# Patient Record
Sex: Female | Born: 1995 | Race: White | Hispanic: No | Marital: Married | Smoking: Never smoker
Health system: Southern US, Community
[De-identification: ages and names within clinical notes are randomized; demographics above are authoritative.]

## PROBLEM LIST (undated history)

## (undated) DIAGNOSIS — G4730 Sleep apnea, unspecified: Secondary | ICD-10-CM

## (undated) DIAGNOSIS — J020 Streptococcal pharyngitis: Secondary | ICD-10-CM

## (undated) HISTORY — PX: TONSILLECTOMY: SUR1361

## (undated) HISTORY — DX: Sleep apnea, unspecified: G47.30

---
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
## 2014-07-16 ENCOUNTER — Encounter: Payer: Self-pay | Admitting: Adult Health

## 2014-07-17 IMAGING — CT CT ORBITS W/ CM
3 series · 15 of 47 positions shown, 18 images · IV contrast (omnipaque)
Comparison: The globes and extraocular muscles appear intact and
symmetrical.

CLINICAL DATA: Bacterial infection in the right eye, worsening
since yesterday.

CT ORBITS WITH CONTRAST
TECHNIQUE: Multidetector CT imaging of the orbits was performed
following the bolus administration of intravenous contrast.
Contrast: 75mL OMNIPAQUE IOHEXOL 300 MG/ML  SOLN

[Series 2: max st axial · axial · 0.33mm/px · z∈[+63,+155]mm · 9 of 54 slices shown, 12 images]
[im 4/54  brain]
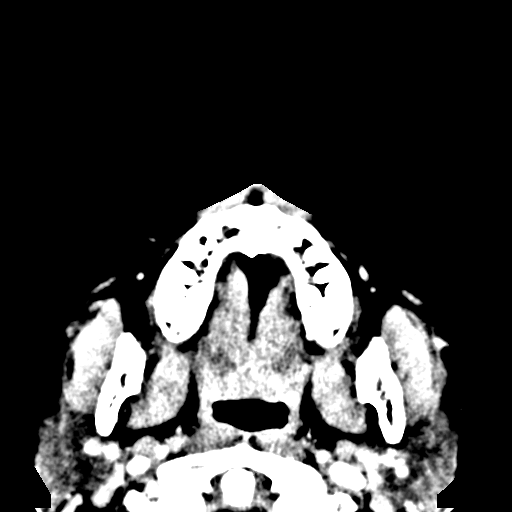
[im 4/54  bone]
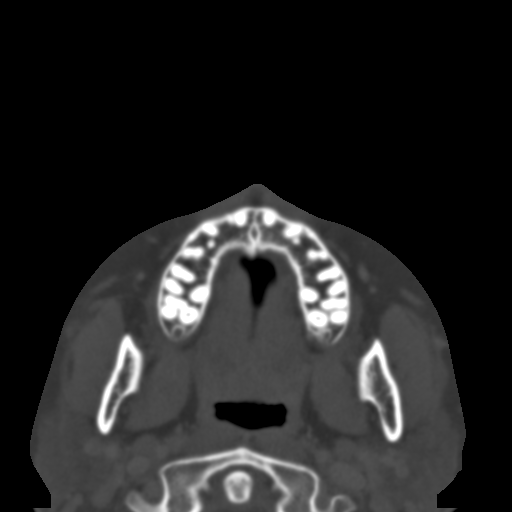
[im 10/54  bone]
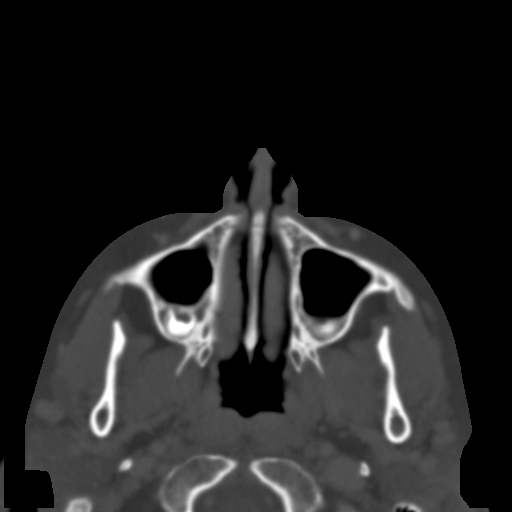
[im 15/54  bone]
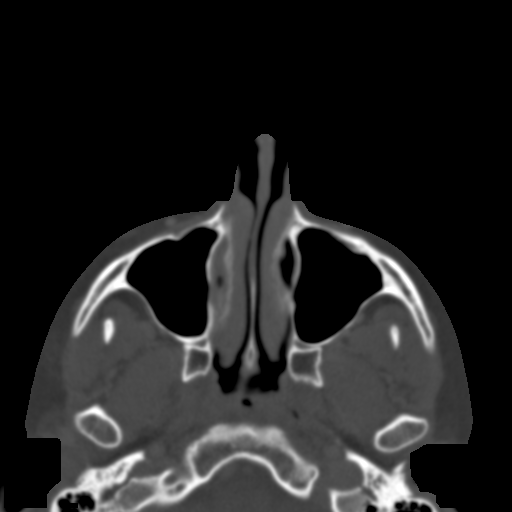
[im 21/54  bone]
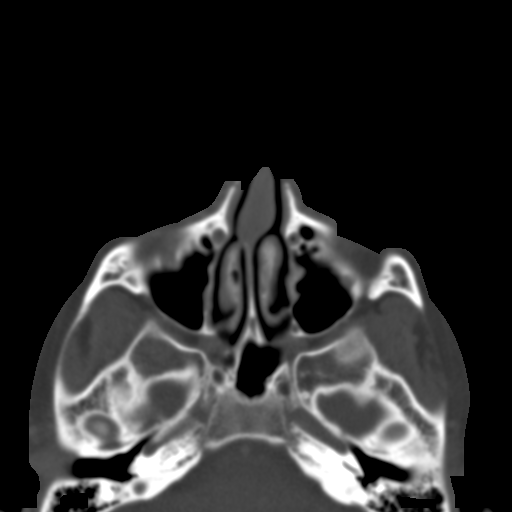
[im 28/54  brain]
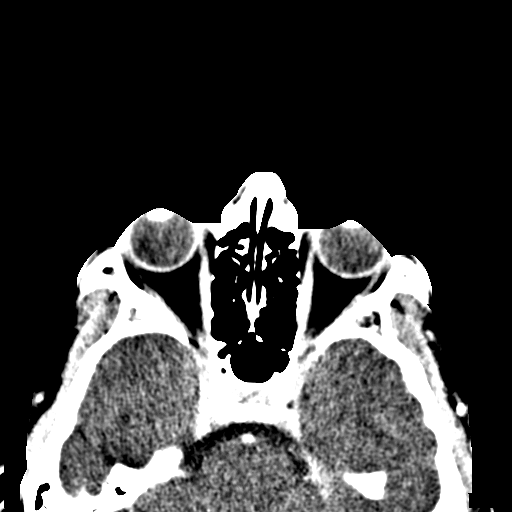
[im 28/54  bone]
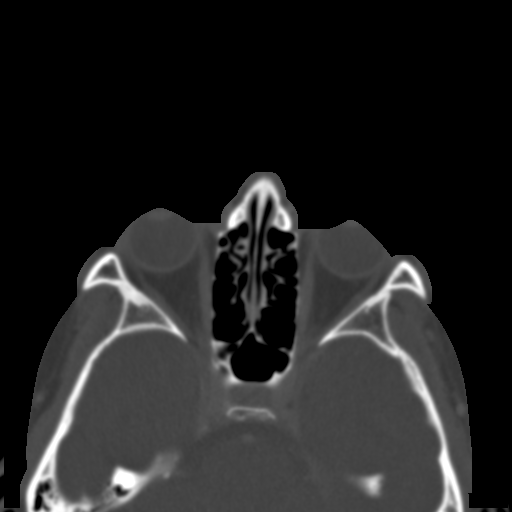
[im 33/54  bone]
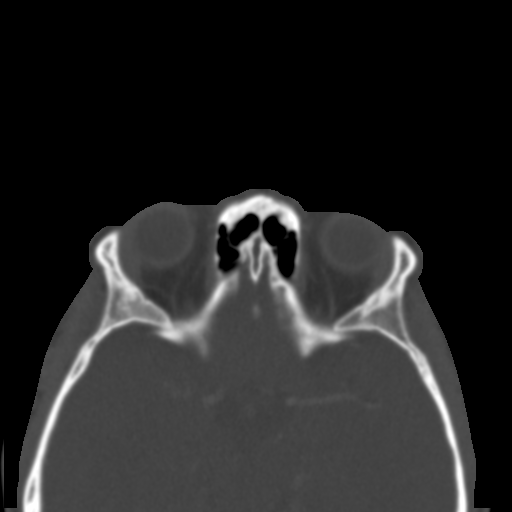
[im 39/54  bone]
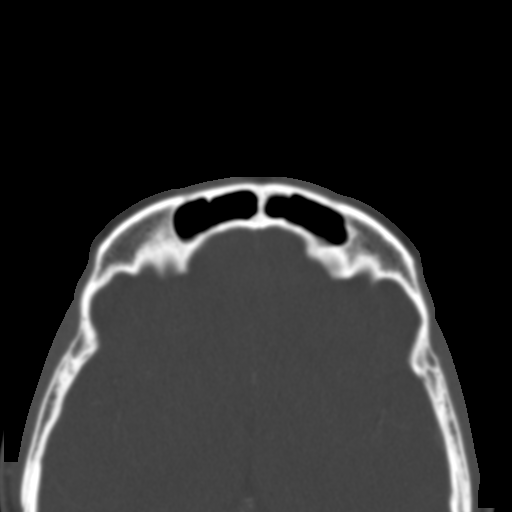
[im 44/54  bone]
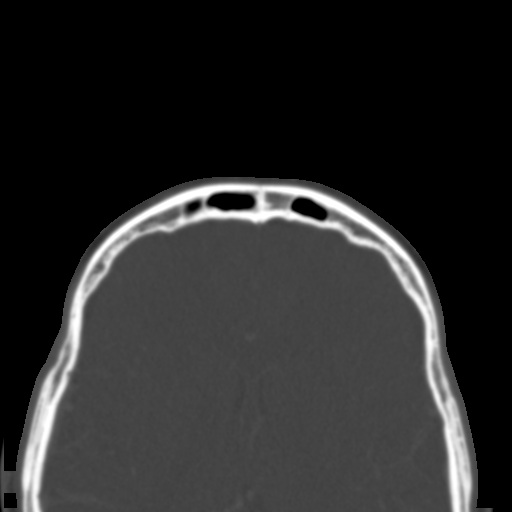
[im 50/54  brain]
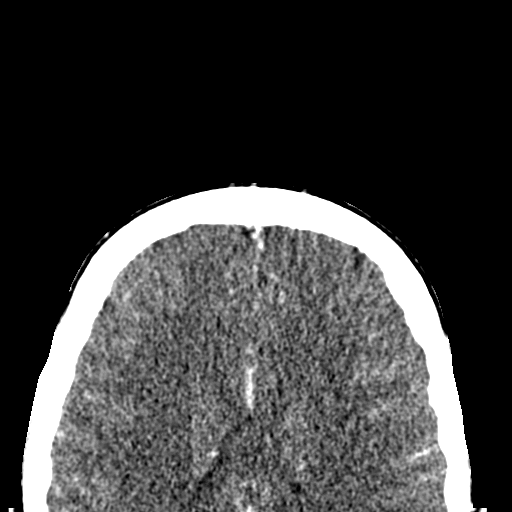
[im 50/54  bone]
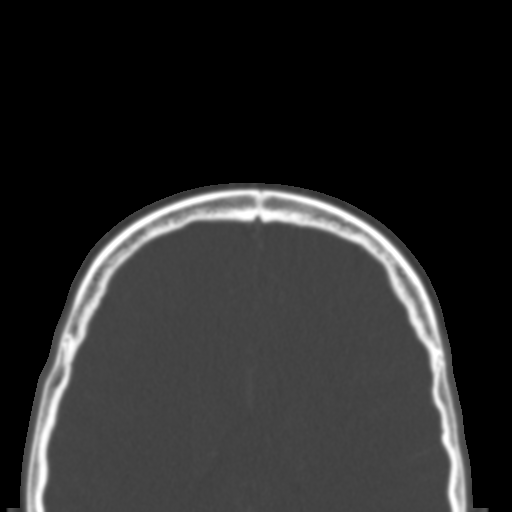

[Series 4: max st coro · coronal · 0.22mm/px · 3 of 57 slices shown]
[im 19/57  bone]
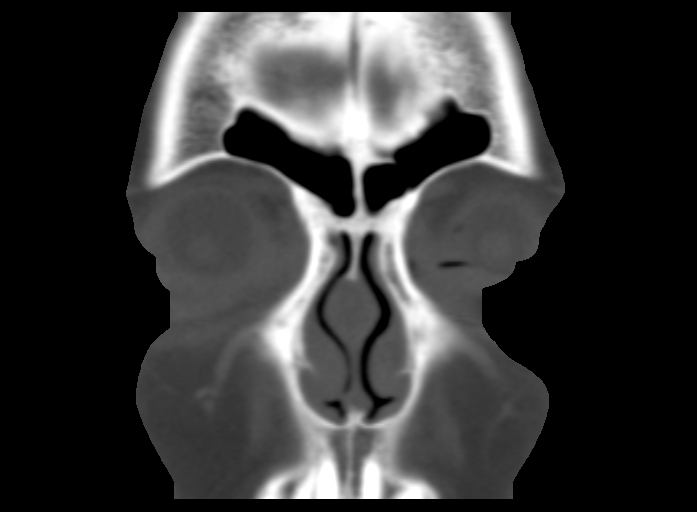
[im 25/57  bone]
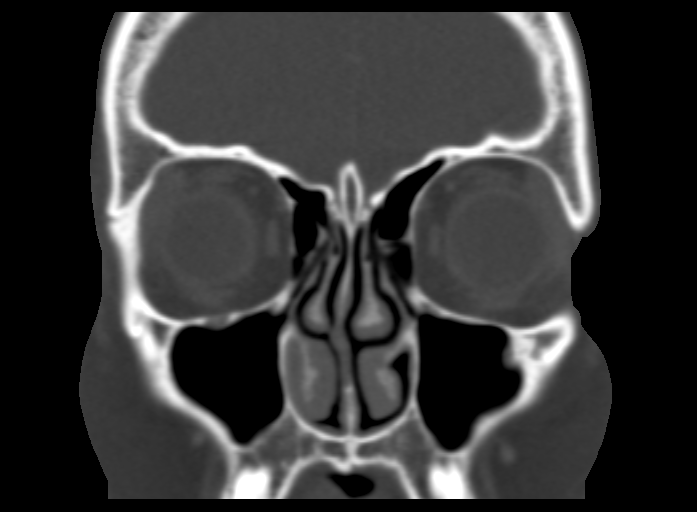
[im 32/57  bone]
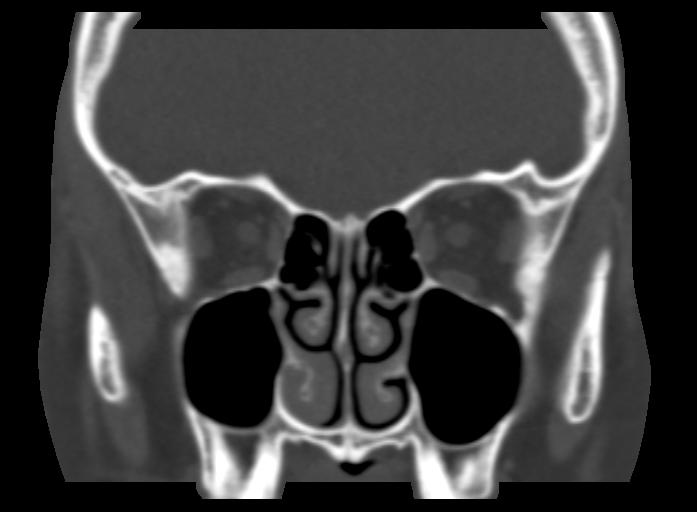

[Series 5: max st sag · sagittal · 0.22mm/px · 3 of 84 slices shown]
[im 28/84  bone]
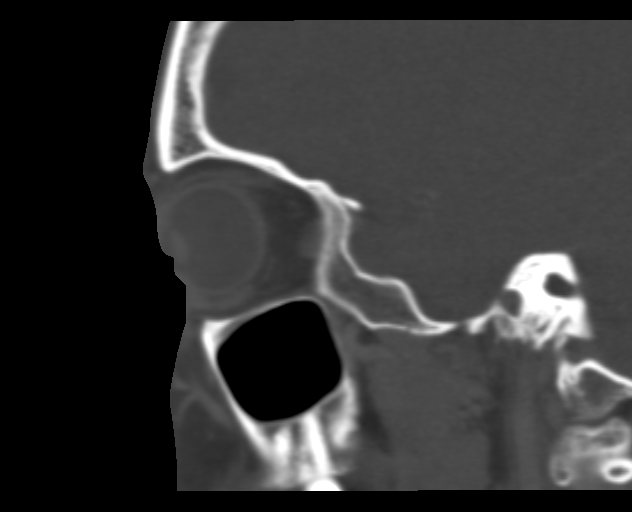
[im 42/84  bone]
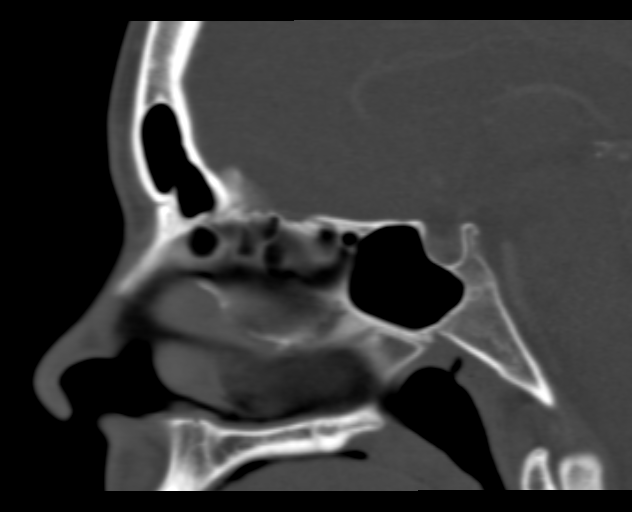
[im 56/84  bone]
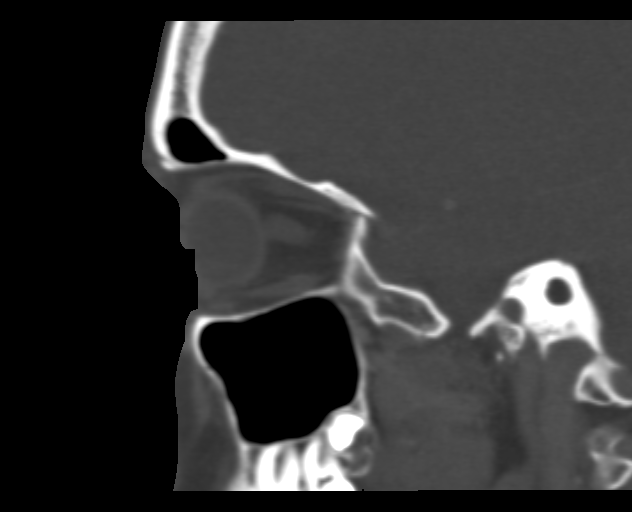

[15 of 47 positions shown; findings below may reference images not displayed]

Mild hyperemia with central low attenuation change
inferior to the right lacrimal glands, slight asymmetry with
respect to the left.  This could represent inflammatory process or
normal variation.  No significant periorbital or retrobulbar
infiltration.  Paranasal sinuses are not opacified.  No acute air-
fluid levels are demonstrated.  The visualized orbital and facial
bones appear intact.  No displaced fractures are identified.
FINDINGS: 
IMPRESSION: Mild asymmetry of the right sided lacrimal glands extending
inferiorly with respect to the left side could represent infectious
or inflammatory process. Tiny abscess not excluded.  No significant
periorbital or retrobulbar infiltration.

## 2015-05-20 DIAGNOSIS — O24419 Gestational diabetes mellitus in pregnancy, unspecified control: Secondary | ICD-10-CM

## 2015-05-20 DIAGNOSIS — O139 Gestational [pregnancy-induced] hypertension without significant proteinuria, unspecified trimester: Secondary | ICD-10-CM

## 2016-10-19 ENCOUNTER — Encounter (HOSPITAL_COMMUNITY): Payer: Self-pay

## 2016-10-19 ENCOUNTER — Emergency Department (HOSPITAL_COMMUNITY)
Admission: EM | Admit: 2016-10-19 | Discharge: 2016-10-20 | Disposition: A | Discharge status: Home / Self Care | Payer: Medicaid Other | Attending: Emergency Medicine | Admitting: Emergency Medicine

## 2016-10-19 DIAGNOSIS — R319 Hematuria, unspecified: Secondary | ICD-10-CM | POA: Insufficient documentation

## 2016-10-19 LAB — CBC WITH DIFFERENTIAL/PLATELET
BASOS ABS: 0.1 10*3/uL (ref 0.0–0.1)
Basophils Relative: 1 %
EOS PCT: 1 %
Eosinophils Absolute: 0.1 10*3/uL (ref 0.0–0.7)
HCT: 41.4 % (ref 36.0–46.0)
Hemoglobin: 14.1 g/dL (ref 12.0–15.0)
LYMPHS PCT: 31 %
Lymphs Abs: 3.6 10*3/uL (ref 0.7–4.0)
MCH: 29.7 pg (ref 26.0–34.0)
MCHC: 34.1 g/dL (ref 30.0–36.0)
MCV: 87.3 fL (ref 78.0–100.0)
Monocytes Absolute: 0.8 10*3/uL (ref 0.1–1.0)
Monocytes Relative: 7 %
Neutro Abs: 7.1 10*3/uL (ref 1.7–7.7)
Neutrophils Relative %: 60 %
PLATELETS: 337 10*3/uL (ref 150–400)
RBC: 4.74 MIL/uL (ref 3.87–5.11)
RDW: 12.8 % (ref 11.5–15.5)
WBC: 11.7 10*3/uL — ABNORMAL HIGH (ref 4.0–10.5)

## 2016-10-19 LAB — BASIC METABOLIC PANEL
ANION GAP: 8 (ref 5–15)
BUN: 13 mg/dL (ref 6–20)
CO2: 26 mmol/L (ref 22–32)
Calcium: 9.3 mg/dL (ref 8.9–10.3)
Chloride: 105 mmol/L (ref 101–111)
Creatinine, Ser: 0.72 mg/dL (ref 0.44–1.00)
GFR calc Af Amer: >60 mL/min (ref >60)
GFR calc non Af Amer: >60 mL/min (ref >60)
Glucose, Bld: 96 mg/dL (ref 65–99)
POTASSIUM: 3.4 mmol/L — AB (ref 3.5–5.1)
SODIUM: 139 mmol/L (ref 135–145)

## 2016-10-19 LAB — PREGNANCY, URINE: PREG TEST UR: NEGATIVE

## 2016-10-19 NOTE — ED Triage Notes (Signed)
Patient states that she noticed some blood in her urine off and on for a couple of months.  Today there is a large amount of blood in her urine.

## 2016-10-19 NOTE — ED Provider Notes (Signed)
[facility]-EMERGENCY DEPT Provider Note   CSN: [id number] Arrival date & time: 10/19/16  2229     History   Chief Complaint Chief Complaint  Patient presents with  . Hematuria    HPI Suzanne James is a 21 y.o. female.  Patient is a 21 year old female who presents to the emergency department complaining of blood in the urine.  The patient states that this problem started approximately a week ago. She noted blood in the commode. She states that she has these episodes approximately 3 times during the course of his 7 day week. Today she noticed more blood than usual, and came to the emergency department. She denies any recent injury or trauma to the bladder or vaginal area. She's not had any pain with urination. There's been no fever, no chills, no back pain and no vomiting. She has not had this problem in the past. She states her last menstrual period was 09/05/2016.      Past Medical History:  Diagnosis Date  . Strep throat     There are no active problems to display for this patient.   Past Surgical History:  Procedure Laterality Date  . TONSILLECTOMY      OB History    Gravida Para Term Preterm AB Living   1             SAB TAB Ectopic Multiple Live Births                   Home Medications    Prior to Admission medications   Medication Sig Start Date End Date Taking? Authorizing Provider  clindamycin (CLEOCIN) 300 MG capsule Take 1 capsule (300 mg total) by mouth 3 (three) times daily. 03/24/13   John-Adam Bonk, MD  erythromycin ophthalmic ointment Place 1 application into the right eye at bedtime.    Historical Provider, MD    Family History No family history on file.  Social History Social History  Substance Use Topics  . Smoking status: Never Smoker  . Smokeless tobacco: Never Used  . Alcohol use No     Allergies   Patient has no known allergies.   Review of Systems Review of Systems  Constitutional: Negative for activity change.       All ROS  Neg except as noted in HPI  HENT: Negative for nosebleeds.   Eyes: Negative for photophobia and discharge.  Respiratory: Negative for cough, shortness of breath and wheezing.   Cardiovascular: Negative for chest pain and palpitations.  Gastrointestinal: Negative for abdominal pain and blood in stool.  Genitourinary: Positive for hematuria. Negative for dysuria, flank pain and frequency.  Musculoskeletal: Negative for arthralgias, back pain and neck pain.  Skin: Negative.   Neurological: Negative for dizziness, seizures and speech difficulty.  Psychiatric/Behavioral: Negative for confusion and hallucinations.     Physical Exam Updated Vital Signs BP 130/77 (BP Location: Left Arm)   Pulse 83   Temp 97.9 F (36.6 C) (Oral)   Resp 16   Ht 5\' 3"  (1.6 m)   Wt 86.2 kg   LMP 09/05/2016 (Exact Date)   SpO2 100%   BMI 33.66 kg/m   Physical Exam  Constitutional: She is oriented to person, place, and time. She appears well-developed and well-nourished.  Non-toxic appearance.  HENT:  Head: Normocephalic.  Right Ear: Tympanic membrane and external ear normal.  Left Ear: Tympanic membrane and external ear normal.  Eyes: EOM and lids are normal. Pupils are equal, round, and reactive to light.  Neck:  Normal range of motion. Neck supple. Carotid bruit is not present.  Cardiovascular: Normal rate, regular rhythm, normal heart sounds, intact distal pulses and normal pulses.   Pulmonary/Chest: Breath sounds normal. No respiratory distress.  Abdominal: Soft. Bowel sounds are normal. There is no tenderness. There is no guarding.  No CVA tenderness appreciated. No lower abdomen pain. No guarding, no rigidity.  Musculoskeletal: Normal range of motion.  Lymphadenopathy:       Head (right side): No submandibular adenopathy present.       Head (left side): No submandibular adenopathy present.    She has no cervical adenopathy.  Neurological: She is alert and oriented to person, place, and time. She  has normal strength. No cranial nerve deficit or sensory deficit.  Skin: Skin is warm and dry.  Psychiatric: She has a normal mood and affect. Her speech is normal.  Nursing note and vitals reviewed.    ED Treatments / Results  Labs (all labs ordered are listed, but only abnormal results are displayed) Labs Reviewed  CBC WITH DIFFERENTIAL/PLATELET - Abnormal; Notable for the following:       Result Value   WBC 11.7 (*)    All other components within normal limits  URINALYSIS, ROUTINE W REFLEX MICROSCOPIC  PREGNANCY, URINE  BASIC METABOLIC PANEL    EKG  EKG Interpretation None       Radiology No results found.  Procedures Procedures (including critical care time)  Medications Ordered in ED Medications - No data to display   Initial Impression / Assessment and Plan / ED Course  I have reviewed the triage vital signs and the nursing notes.  Pertinent labs & imaging results that were available during my care of the patient were reviewed by me and considered in my medical decision making (see chart for details).     *I have reviewed nursing notes, vital signs, and all appropriate lab and imaging results for this patient.**  Final Clinical Impressions(s) / ED Diagnoses  Vital signs within normal limits. Vital signs shows potassium to be low at 3.4, otherwise within normal limits. The anion gap is 8, within normal limits. The complete blood count shows the white blood cells to be elevated at 11,700, there is no shift to the left. Urinalysis shows a red hazy specimen with a specific gravity 1.010. There is a large hemoglobin present. There is a trace of leukocytes. The microscopic shows too many to count red cells, 6-30 white blood cells, few bacteria, and 6-30 squamous cells. Urine pregnancy is negative.  The urine will be sent to the lab for culture. Patient will be placed on Keflex. I've asked the patient to observe for any changes in her menstrual cycle.   Final  diagnoses:  None    New Prescriptions New Prescriptions   No medications on file     Ivery Quale, PA-C 10/20/16 0050    Shon Baton, MD 10/20/16 0300

## 2016-10-20 LAB — URINALYSIS, ROUTINE W REFLEX MICROSCOPIC
BILIRUBIN URINE: NEGATIVE
Glucose, UA: NEGATIVE mg/dL
Ketones, ur: NEGATIVE mg/dL
NITRITE: NEGATIVE
Protein, ur: 30 mg/dL — AB
Specific Gravity, Urine: 1.01 (ref 1.005–1.030)
pH: 5 (ref 5.0–8.0)

## 2016-10-20 LAB — URINALYSIS, MICROSCOPIC (REFLEX)

## 2016-10-20 MED ORDER — ONDANSETRON HCL 4 MG PO TABS
4.0000 mg | ORAL_TABLET | Freq: Once | ORAL | Status: AC
  Administered 2016-10-20: 4 mg via ORAL
  Filled 2016-10-20: qty 1

## 2016-10-20 MED ORDER — CEPHALEXIN 500 MG PO CAPS
500.0000 mg | ORAL_CAPSULE | Freq: Once | ORAL | Status: AC
  Administered 2016-10-20: 500 mg via ORAL
  Filled 2016-10-20: qty 1

## 2016-10-20 MED ORDER — CEPHALEXIN 500 MG PO CAPS: 500.0000 mg | ORAL_CAPSULE | Freq: Four times a day (QID) | ORAL | 0 refills | Status: DC

## 2016-10-20 NOTE — Discharge Instructions (Signed)
Your vital signs within normal limits. Your urine pregnancy test is negative. Your urine analysis questions a urinary tract infection. A culture has been sent to the lab. You'll be started on antibiotic Keflex to be used 4 times daily with food. Please increase fluids. Please monitor for any menstrual changes. Please see your primary physician or return to the emergency department if any changes, problems, or concerns.

## 2016-10-21 LAB — URINE CULTURE

## 2016-10-28 NOTE — ED Notes (Signed)
Pt called inquiring about culture results. Consulted EDP regarding culture results and reported culture did not grow out anything and for pt to follow up with PCP. Pt contacted and informed regarding culture results and verbalized understanding of following up with PCP.

## 2020-06-14 ENCOUNTER — Encounter: Payer: Self-pay | Admitting: Women's Health

## 2020-06-14 ENCOUNTER — Ambulatory Visit (INDEPENDENT_AMBULATORY_CARE_PROVIDER_SITE_OTHER): Payer: BLUE CROSS/BLUE SHIELD | Admitting: Women's Health

## 2020-06-14 VITALS — BP 131/74 | HR 87 | Ht 63.0 in | Wt 222.0 lb

## 2020-06-14 DIAGNOSIS — Z3201 Encounter for pregnancy test, result positive: Secondary | ICD-10-CM | POA: Diagnosis not present

## 2020-06-14 DIAGNOSIS — Z3491 Encounter for supervision of normal pregnancy, unspecified, first trimester: Secondary | ICD-10-CM

## 2020-06-14 DIAGNOSIS — F419 Anxiety disorder, unspecified: Secondary | ICD-10-CM | POA: Diagnosis not present

## 2020-06-14 LAB — POCT URINE PREGNANCY: Preg Test, Ur: POSITIVE — AB

## 2020-06-14 MED ORDER — SERTRALINE HCL 25 MG PO TABS: 25.0000 mg | ORAL_TABLET | Freq: Every day | ORAL | 6 refills | Status: DC

## 2020-06-14 NOTE — Progress Notes (Signed)
   GYN VISIT Patient name: Suzanne James MRN [id number]  Date of birth: 1996/01/02 Chief Complaint:   Possible Pregnancy  History of Present Illness:   Suzanne James is a 24 y.o. G71P0 Caucasian female at [redacted]w[redacted]d by LMP of 8/1, being seen today for +HPT. Some bleeding earlier in pregnancy, now just occ light brown-was being followed by midwife in [place], decided to switch care to here b/c was having a hard time getting appointments.  Some nausea, no vomiting. Declines meds. Some anxiety- has h/o anxiety, was on zoloft 25mg ,stopped in April b/c she felt better, but feels it is returning and needs to restart meds. Denies depression.   Depression screen PHQ 2/9 06/14/2020  Decreased Interest 0  Down, Depressed, Hopeless 0  PHQ - 2 Score 0  Altered sleeping 0  Tired, decreased energy 0  Change in appetite 0  Feeling bad or failure about yourself  0  Trouble concentrating 0  Moving slowly or fidgety/restless 0  Suicidal thoughts 0  PHQ-9 Score 0  Difficult doing work/chores Not difficult at all    Patient's last menstrual period was 04/14/2020. Review of Systems:   Pertinent items are noted in HPI Denies fever/chills, dizziness, headaches, visual disturbances, fatigue, shortness of breath, chest pain, abdominal pain, vomiting, abnormal vaginal discharge/itching/odor/irritation, problems with periods, bowel movements, urination, or intercourse unless otherwise stated above.  Pertinent History Reviewed:  Reviewed past medical,surgical, social, obstetrical and family history.  Reviewed problem list, medications and allergies. Physical Assessment:   Vitals:   06/14/20 1206  BP: 131/74  Pulse: 87  Weight: 222 lb (100.7 kg)  Height: 5\' 3"  (1.6 m)  Body mass index is 39.33 kg/m.       Physical Examination:   General appearance: alert, well appearing, and in no distress  Mental status: alert, oriented to person, place, and time  Skin: warm & dry   Cardiovascular: normal heart rate  noted  Respiratory: normal respiratory effort, no distress  Abdomen: soft, non-tender   Pelvic: examination not indicated  Extremities: no edema   Informal TA u/s: +FCA, ~7-8wks  Chaperone: n/a    Results for orders placed or performed in visit on 06/14/20 (from the past 24 hour(s))  POCT urine pregnancy   Collection Time: 06/14/20 12:05 PM  Result Value Ref Range   Preg Test, Ur Positive (A) Negative    Assessment & Plan:  1) Pregnant> [redacted]w[redacted]d by LMP, will get dating u/s, then schedule new ob  2) Anxiety> restart zoloft 25mg , rx sent  Meds:  Meds ordered this encounter  Medications  . sertraline (ZOLOFT) 25 MG tablet    Sig: Take 1 tablet (25 mg total) by mouth daily.    Dispense:  30 tablet    Refill:  6    Order Specific Question:   Supervising Provider    Answer:   08/14/20 H [[id number]]    Orders Placed This Encounter  Procedures  . [redacted]w[redacted]d OB Comp Less 14 Wks  . POCT urine pregnancy    Return for 1st available, dating u/s.  CNM, [facility] 06/14/2020 12:57 PM

## 2020-06-14 NOTE — Patient Instructions (Signed)
Suzanne James, I greatly value your feedback.  If you receive a survey following your visit with [place] today, we appreciate you taking the time to fill it out.  Thanks, Joellyn Haff, CNM, WHNP-BC   [facility] ([address] [place], [place] [address]) Entrance C, located off of [address] Free 24/7 valet parking   Nausea & Vomiting  Have saltine crackers or pretzels by your bed and eat a few bites before you raise your head out of bed in the morning  Eat small frequent meals throughout the day instead of large meals  Drink plenty of fluids throughout the day to stay hydrated, just don't drink a lot of fluids with your meals.  This can make your stomach fill up faster making you feel sick  Do not brush your teeth right after you eat  Products with real ginger are good for nausea, like ginger ale and ginger hard candy Make sure it says made with real ginger!  Sucking on sour candy like lemon heads is also good for nausea  If your prenatal vitamins make you nauseated, take them at night so you will sleep through the nausea  Sea Bands  If you feel like you need medicine for the nausea & vomiting please let us know  If you are unable to keep any fluids or food down please let us know   Constipation  Drink plenty of fluid, preferably water, throughout the day  Eat foods high in fiber such as fruits, vegetables, and grains  Exercise, such as walking, is a good way to keep your bowels regular  Drink warm fluids, especially warm prune juice, or decaf coffee  Eat a 1/2 cup of real oatmeal (not instant), 1/2 cup applesauce, and 1/2-1 cup warm prune juice every day  If needed, you may take Colace (docusate sodium) stool softener once or twice a day to help keep the stool soft.   If you still are having problems with constipation, you may take Miralax once daily as needed to help keep your bowels regular.   Home Blood Pressure Monitoring for Patients    Your provider has recommended that you check your blood pressure (BP) at least once a week at home. If you do not have a blood pressure cuff at home, one will be provided for you. Contact your provider if you have not received your monitor within 1 week.   Helpful Tips for Accurate Home Blood Pressure Checks   Don't smoke, exercise, or drink caffeine 30 minutes before checking your BP  Use the restroom before checking your BP (a full bladder can raise your pressure)  Relax in a comfortable upright chair  Feet on the ground  Left arm resting comfortably on a flat surface at the level of your heart  Legs uncrossed  Back supported  Sit quietly and don't talk  Place the cuff on your bare arm  Adjust snuggly, so that only two fingertips can fit between your skin and the top of the cuff  Check 2 readings separated by at least one minute  Keep a log of your BP readings  For a visual, please reference this diagram: http://ccnc.care/bpdiagram  Provider Name: [facility]     Phone: [phone]  Zone 1: ALL CLEAR  Continue to monitor your symptoms:   BP reading is less than 140 (top number) or less than 90 (bottom number)   No right upper stomach pain  No headaches or seeing  spots  No feeling nauseated or throwing up  No swelling in face and hands  Zone 2: CAUTION Call your doctor's office for any of the following:   BP reading is greater than 140 (top number) or greater than 90 (bottom number)   Stomach pain under your ribs in the middle or right side  Headaches or seeing spots  Feeling nauseated or throwing up  Swelling in face and hands  Zone 3: EMERGENCY  Seek immediate medical care if you have any of the following:   BP reading is greater than160 (top number) or greater than 110 (bottom number)  Severe headaches not improving with Tylenol  Serious difficulty catching your breath  Any worsening symptoms from Zone 2    First Trimester of  Pregnancy The first trimester of pregnancy is from week 1 until the end of week 12 (months 1 through 3). A week after a sperm fertilizes an egg, the egg will implant on the wall of the uterus. This embryo will begin to develop into a baby. Genes from you and your partner are forming the baby. The female genes determine whether the baby is a boy or a girl. At 6-8 weeks, the eyes and face are formed, and the heartbeat can be seen on ultrasound. At the end of 12 weeks, all the baby's organs are formed.  Now that you are pregnant, you will want to do everything you can to have a healthy baby. Two of the most important things are to get good prenatal care and to follow your health care provider's instructions. Prenatal care is all the medical care you receive before the baby's birth. This care will help prevent, find, and treat any problems during the pregnancy and childbirth. BODY CHANGES Your body goes through many changes during pregnancy. The changes vary from woman to woman.   You may gain or lose a couple of pounds at first.  You may feel sick to your stomach (nauseous) and throw up (vomit). If the vomiting is uncontrollable, call your health care provider.  You may tire easily.  You may develop headaches that can be relieved by medicines approved by your health care provider.  You may urinate more often. Painful urination may mean you have a bladder infection.  You may develop heartburn as a result of your pregnancy.  You may develop constipation because certain hormones are causing the muscles that push waste through your intestines to slow down.  You may develop hemorrhoids or swollen, bulging veins (varicose veins).  Your breasts may begin to grow larger and become tender. Your nipples may stick out more, and the tissue that surrounds them (areola) may become darker.  Your gums may bleed and may be sensitive to brushing and flossing.  Dark spots or blotches (chloasma, mask of pregnancy)  may develop on your face. This will likely fade after the baby is born.  Your menstrual periods will stop.  You may have a loss of appetite.  You may develop cravings for certain kinds of food.  You may have changes in your emotions from day to day, such as being excited to be pregnant or being concerned that something may go wrong with the pregnancy and baby.  You may have more vivid and strange dreams.  You may have changes in your hair. These can include thickening of your hair, rapid growth, and changes in texture. Some women also have hair loss during or after pregnancy, or hair that feels dry or thin. Your hair  will most likely return to normal after your baby is born. WHAT TO EXPECT AT YOUR PRENATAL VISITS During a routine prenatal visit:  You will be weighed to make sure you and the baby are growing normally.  Your blood pressure will be taken.  Your abdomen will be measured to track your baby's growth.  The fetal heartbeat will be listened to starting around week 10 or 12 of your pregnancy.  Test results from any previous visits will be discussed. Your health care provider may ask you:  How you are feeling.  If you are feeling the baby move.  If you have had any abnormal symptoms, such as leaking fluid, bleeding, severe headaches, or abdominal cramping.  If you have any questions. Other tests that may be performed during your first trimester include:  Blood tests to find your blood type and to check for the presence of any previous infections. They will also be used to check for low iron levels (anemia) and Rh antibodies. Later in the pregnancy, blood tests for diabetes will be done along with other tests if problems develop.  Urine tests to check for infections, diabetes, or protein in the urine.  An ultrasound to confirm the proper growth and development of the baby.  An amniocentesis to check for possible genetic problems.  Fetal screens for spina bifida and  Down syndrome.  You may need other tests to make sure you and the baby are doing well. HOME CARE INSTRUCTIONS  Medicines  Follow your health care provider's instructions regarding medicine use. Specific medicines may be either safe or unsafe to take during pregnancy.  Take your prenatal vitamins as directed.  If you develop constipation, try taking a stool softener if your health care provider approves. Diet  Eat regular, well-balanced meals. Choose a variety of foods, such as meat or vegetable-based protein, fish, milk and low-fat dairy products, vegetables, fruits, and whole grain breads and cereals. Your health care provider will help you determine the amount of weight gain that is right for you.  Avoid raw meat and uncooked cheese. These carry germs that can cause birth defects in the baby.  Eating four or five small meals rather than three large meals a day may help relieve nausea and vomiting. If you start to feel nauseous, eating a few soda crackers can be helpful. Drinking liquids between meals instead of during meals also seems to help nausea and vomiting.  If you develop constipation, eat more high-fiber foods, such as fresh vegetables or fruit and whole grains. Drink enough fluids to keep your urine clear or pale yellow. Activity and Exercise  Exercise only as directed by your health care provider. Exercising will help you:  Control your weight.  Stay in shape.  Be prepared for labor and delivery.  Experiencing pain or cramping in the lower abdomen or low back is a good sign that you should stop exercising. Check with your health care provider before continuing normal exercises.  Try to avoid standing for long periods of time. Move your legs often if you must stand in one place for a long time.  Avoid heavy lifting.  Wear low-heeled shoes, and practice good posture.  You may continue to have sex unless your health care provider directs you otherwise. Relief of Pain  or Discomfort  Wear a good support bra for breast tenderness.    Take warm sitz baths to soothe any pain or discomfort caused by hemorrhoids. Use hemorrhoid cream if your health care provider approves.  Rest with your legs elevated if you have leg cramps or low back pain.  If you develop varicose veins in your legs, wear support hose. Elevate your feet for 15 minutes, 3-4 times a day. Limit salt in your diet. Prenatal Care  Schedule your prenatal visits by the twelfth week of pregnancy. They are usually scheduled monthly at first, then more often in the last 2 months before delivery.  Write down your questions. Take them to your prenatal visits.  Keep all your prenatal visits as directed by your health care provider. Safety  Wear your seat belt at all times when driving.  Make a list of emergency phone numbers, including numbers for family, friends, the hospital, and police and fire departments. General Tips  Ask your health care provider for a referral to a local prenatal education class. Begin classes no later than at the beginning of month 6 of your pregnancy.  Ask for help if you have counseling or nutritional needs during pregnancy. Your health care provider can offer advice or refer you to specialists for help with various needs.  Do not use hot tubs, steam rooms, or saunas.  Do not douche or use tampons or scented sanitary pads.  Do not cross your legs for long periods of time.  Avoid cat litter boxes and soil used by cats. These carry germs that can cause birth defects in the baby and possibly loss of the fetus by miscarriage or stillbirth.  Avoid all smoking, herbs, alcohol, and medicines not prescribed by your health care provider. Chemicals in these affect the formation and growth of the baby.  Schedule a dentist appointment. At home, brush your teeth with a soft toothbrush and be gentle when you floss. SEEK MEDICAL CARE IF:   You have dizziness.  You have mild  pelvic cramps, pelvic pressure, or nagging pain in the abdominal area.  You have persistent nausea, vomiting, or diarrhea.  You have a bad smelling vaginal discharge.  You have pain with urination.  You notice increased swelling in your face, hands, legs, or ankles. SEEK IMMEDIATE MEDICAL CARE IF:   You have a fever.  You are leaking fluid from your vagina.  You have spotting or bleeding from your vagina.  You have severe abdominal cramping or pain.  You have rapid weight gain or loss.  You vomit blood or material that looks like coffee grounds.  You are exposed to [place] measles and have never had them.  You are exposed to fifth disease or chickenpox.  You develop a severe headache.  You have shortness of breath.  You have any kind of trauma, such as from a fall or a car accident. Document Released: 08/25/2001 Document Revised: 01/15/2014 Document Reviewed: 07/11/2013 [facility] Patient Information 2015 [place], [place]. This information is not intended to replace advice given to you by your health care provider. Make sure you discuss any questions you have with your health care provider.

## 2020-07-03 ENCOUNTER — Ambulatory Visit (INDEPENDENT_AMBULATORY_CARE_PROVIDER_SITE_OTHER): Payer: BC Managed Care – PPO

## 2020-07-03 DIAGNOSIS — Z3491 Encounter for supervision of normal pregnancy, unspecified, first trimester: Secondary | ICD-10-CM

## 2020-07-03 NOTE — Progress Notes (Signed)
[place] 10+2 wks,single IUP with ys,crl 34.25 mm,fhr 176 bpm,normal ovaries

## 2020-07-16 ENCOUNTER — Encounter: Payer: Self-pay | Admitting: Advanced Practice Midwife

## 2020-07-16 DIAGNOSIS — O09299 Supervision of pregnancy with other poor reproductive or obstetric history, unspecified trimester: Secondary | ICD-10-CM | POA: Insufficient documentation

## 2020-07-16 DIAGNOSIS — O0990 Supervision of high risk pregnancy, unspecified, unspecified trimester: Secondary | ICD-10-CM | POA: Insufficient documentation

## 2020-07-16 DIAGNOSIS — Z8759 Personal history of other complications of pregnancy, childbirth and the puerperium: Secondary | ICD-10-CM | POA: Insufficient documentation

## 2020-07-16 DIAGNOSIS — Z3490 Encounter for supervision of normal pregnancy, unspecified, unspecified trimester: Secondary | ICD-10-CM | POA: Insufficient documentation

## 2020-07-17 ENCOUNTER — Other Ambulatory Visit: Payer: Self-pay | Admitting: Obstetrics & Gynecology

## 2020-07-17 DIAGNOSIS — Z3682 Encounter for antenatal screening for nuchal translucency: Secondary | ICD-10-CM

## 2020-07-18 ENCOUNTER — Ambulatory Visit (INDEPENDENT_AMBULATORY_CARE_PROVIDER_SITE_OTHER): Payer: BC Managed Care – PPO | Admitting: Advanced Practice Midwife

## 2020-07-18 ENCOUNTER — Ambulatory Visit: Payer: BC Managed Care – PPO | Admitting: *Deleted

## 2020-07-18 ENCOUNTER — Encounter: Payer: Self-pay | Admitting: Advanced Practice Midwife

## 2020-07-18 ENCOUNTER — Encounter: Payer: Self-pay | Admitting: *Deleted

## 2020-07-18 ENCOUNTER — Ambulatory Visit (INDEPENDENT_AMBULATORY_CARE_PROVIDER_SITE_OTHER): Payer: BC Managed Care – PPO

## 2020-07-18 VITALS — BP 136/87 | HR 87 | Wt 224.0 lb

## 2020-07-18 DIAGNOSIS — Z3480 Encounter for supervision of other normal pregnancy, unspecified trimester: Secondary | ICD-10-CM

## 2020-07-18 DIAGNOSIS — Z3A12 12 weeks gestation of pregnancy: Secondary | ICD-10-CM

## 2020-07-18 DIAGNOSIS — Z331 Pregnant state, incidental: Secondary | ICD-10-CM

## 2020-07-18 DIAGNOSIS — Z1389 Encounter for screening for other disorder: Secondary | ICD-10-CM

## 2020-07-18 DIAGNOSIS — Z8632 Personal history of gestational diabetes: Secondary | ICD-10-CM

## 2020-07-18 DIAGNOSIS — Z3682 Encounter for antenatal screening for nuchal translucency: Secondary | ICD-10-CM

## 2020-07-18 DIAGNOSIS — Z8759 Personal history of other complications of pregnancy, childbirth and the puerperium: Secondary | ICD-10-CM

## 2020-07-18 DIAGNOSIS — O09299 Supervision of pregnancy with other poor reproductive or obstetric history, unspecified trimester: Secondary | ICD-10-CM

## 2020-07-18 LAB — POCT URINALYSIS DIPSTICK OB
Blood, UA: NEGATIVE
Glucose, UA: NEGATIVE
Ketones, UA: NEGATIVE
Leukocytes, UA: NEGATIVE
Nitrite, UA: NEGATIVE
POC,PROTEIN,UA: NEGATIVE

## 2020-07-18 MED ORDER — SERTRALINE HCL 25 MG PO TABS: 25.0000 mg | ORAL_TABLET | Freq: Every day | ORAL | 6 refills | Status: DC

## 2020-07-18 MED ORDER — ONDANSETRON 4 MG PO TBDP
4.0000 mg | ORAL_TABLET | Freq: Four times a day (QID) | ORAL | 2 refills | Status: DC | PRN
Start: 2020-07-18 — End: 2020-12-11

## 2020-07-18 MED ORDER — ASPIRIN EC 81 MG PO TBEC: 162.0000 mg | DELAYED_RELEASE_TABLET | Freq: Every day | ORAL | 6 refills | Status: DC

## 2020-07-18 NOTE — Progress Notes (Signed)
   NURSE VISIT- [facility]  SUBJECTIVE:  Suzanne James is a 24 y.o. G27P2002 female here for Panorama NIPT . She is [redacted]w[redacted]d pregnant.   OBJECTIVE:  Appears well, in no apparent distress  Blood work drawn from right [facility] without difficulty. 1 attempt(s).   ASSESSMENT: Pregnancy [redacted]w[redacted]d Panorama NIPT  PLAN: [facility] portal information given and instructed patient how to access results   Jobe Marker  07/18/2020 3:18 PM   Jacklyn Shell

## 2020-07-18 NOTE — Progress Notes (Signed)
[place] 12+3 wks,measurements c/w dates,crl 60.09 mm,fhr 171 bpm,posterior placenta,NB present,NT 2.3 mm,normal ovaries

## 2020-07-18 NOTE — Progress Notes (Signed)
INITIAL OBSTETRICAL VISIT Patient name: Suzanne James MRN [id number]  Date of birth: 09-01-1996 Chief Complaint:   Initial Prenatal Visit (nt/it)  History of Present Illness:   Suzanne James is a 24 y.o. G20P2002 Caucasian female at [redacted]w[redacted]d by [place] at 10 weeks with an Estimated Date of Delivery: 01/27/21 being seen today for her initial obstetrical visit.   Her obstetrical history is significant for A1DM, GHTN and IUGR.  Never dx w/HTN outside of pregnancy, but "borderline". Today she reports nausea. Hx of anxiety after pregnancy, took zoloft w/good results.  Had felt better earlier this year, so stopped it, wants to restart.  Hasn't picked up rx yet.  Depression screen [facility] 2/9 07/18/2020 06/14/2020  Decreased Interest 1 0  Down, Depressed, Hopeless 0 0  PHQ - 2 Score 1 0  Altered sleeping 3 0  Tired, decreased energy 2 0  Change in appetite 3 0  Feeling bad or failure about yourself  0 0  Trouble concentrating 2 0  Moving slowly or fidgety/restless 0 0  Suicidal thoughts 0 0  PHQ-9 Score 11 0  Difficult doing work/chores - Not difficult at all    Patient's last menstrual period was 04/14/2020. Last pap 12/2019. Results were: normal Review of Systems:   Pertinent items are noted in HPI Denies cramping/contractions, leakage of fluid, vaginal bleeding, abnormal vaginal discharge w/ itching/odor/irritation, headaches, visual changes, shortness of breath, chest pain, abdominal pain, severe nausea/vomiting, or problems with urination or bowel movements unless otherwise stated above.  Pertinent History Reviewed:  Reviewed past medical,surgical, social, obstetrical and family history.  Reviewed problem list, medications and allergies. OB History  Gravida Para Term Preterm AB Living  3 2 2     2   SAB TAB Ectopic Multiple Live Births          2    # Outcome Date GA Lbr Len/2nd Weight Sex Delivery Anes PTL Lv  3 Current           2 Term 09/27/17 [redacted]w[redacted]d  6 lb 6 oz (2.892 kg) F Vag-Spont EPI N  LIV  1 Term 05/20/15 [redacted]w[redacted]d  5 lb 8 oz (2.495 kg) F Vag-Spont EPI N LIV     Complications: IUGR (intrauterine growth retardation) of newborn, Gestational diabetes, Gestational hypertension   Physical Assessment:   Vitals:   07/18/20 1452  BP: 136/87  Pulse: 87  Weight: 224 lb (101.6 kg)  Body mass index is 39.68 kg/m.       Physical Examination:  General appearance - well appearing, and in no distress  Mental status - alert, oriented to person, place, and time  Psych:  She has a normal mood and affect  Skin - warm and dry, normal color, no suspicious lesions noted  Chest - effort normal  Heart - normal rate and regular rhythm  Abdomen - soft, nontender  Extremities:  No swelling     TODAY'S NT 13/04/21 12+3 wks,measurements c/w dates,crl 60.09 mm,fhr 171 bpm,posterior placenta,NB present,NT 2.3 mm,normal ovaries   Results for orders placed or performed in visit on 07/18/20 (from the past 24 hour(s))  POC Urinalysis Dipstick OB   Collection Time: 07/18/20  3:13 PM  Result Value Ref Range   Color, UA     Clarity, UA     Glucose, UA Negative Negative   Bilirubin, UA     Ketones, UA neg    Spec Grav, UA     Blood, UA neg    pH, UA  POC,PROTEIN,UA Negative Negative, Trace, Small (1+), Moderate (2+), Large (3+), 4+   Urobilinogen, UA     Nitrite, UA neg    Leukocytes, UA Negative Negative   Appearance     Odor      Assessment & Plan:  1) Low-Risk Pregnancy G3P2002 at [redacted]w[redacted]d with an Estimated Date of Delivery: 01/27/21   2) Initial OB visit  3) Hx GHTN, A1DM and IUGR  Meds:  Meds ordered this encounter  Medications  . DISCONTD: aspirin EC 81 MG tablet    Sig: Take 2 tablets (162 mg total) by mouth daily.    Dispense:  60 tablet    Refill:  6    Order Specific Question:   Supervising Provider    Answer:   Despina Hidden, LUTHER H [[id number]]  . ondansetron (ZOFRAN ODT) 4 MG disintegrating tablet    Sig: Take 1 tablet (4 mg total) by mouth every 6 (six) hours as needed for nausea.     Dispense:  30 tablet    Refill:  2    Order Specific Question:   Supervising Provider    Answer:   Despina Hidden, LUTHER H [[id number]]  . sertraline (ZOLOFT) 25 MG tablet    Sig: Take 1 tablet (25 mg total) by mouth daily.    Dispense:  30 tablet    Refill:  6    Order Specific Question:   Supervising Provider    Answer:   Despina Hidden, LUTHER H [[id number]]  . aspirin EC 81 MG tablet    Sig: Take 2 tablets (162 mg total) by mouth daily.    Dispense:  60 tablet    Refill:  6    Order Specific Question:   Supervising Provider    Answer:   Duane Lope H [[id number]]    Initial labs obtained HgbA1C Start ASA 162mg  Continue prenatal vitamins Reviewed n/v relief measures and warning s/s to report Reviewed recommended weight gain based on pre-gravid BMI Encouraged well-balanced diet Genetic & carrier screening discussed: requests Panorama, NT/IT and Horizon 14 , declines AFP Ultrasound discussed; fetal survey: requested [facility] completed> form faxed if has or is planning to apply for medicaid The nature of [facility] for with multiple MDs and other Advanced Practice Providers was explained to patient; also emphasized that fellows, residents, and students are part of our team. Given a home bp cuff. Check bp weekly, let [employer] know if >140/90.        [place] Cresenzo-Dishmon 7:13 PM

## 2020-07-18 NOTE — Patient Instructions (Signed)
Suzanne James, I greatly value your feedback.  If you receive a survey following your visit with [place] today, we appreciate you taking the time to fill it out.  Thanks, Cathie Beams, DNP, CNM  [facility] HAS MOVED!!! It is now [facility] ([address] [place], [place] [address]) Entrance located off of [address] Free 24/7 valet parking   Nausea & Vomiting  Have saltine crackers or pretzels by your bed and eat a few bites before you raise your head out of bed in the morning  Eat small frequent meals throughout the day instead of large meals  Drink plenty of fluids throughout the day to stay hydrated, just don't drink a lot of fluids with your meals.  This can make your stomach fill up faster making you feel sick  Do not brush your teeth right after you eat  Products with real ginger are good for nausea, like ginger ale and ginger hard candy Make sure it says made with real ginger!  Sucking on sour candy like lemon heads is also good for nausea  If your prenatal vitamins make you nauseated, take them at night so you will sleep through the nausea  Sea Bands  If you feel like you need medicine for the nausea & vomiting please let us know  If you are unable to keep any fluids or food down please let us know   Constipation  Drink plenty of fluid, preferably water, throughout the day  Eat foods high in fiber such as fruits, vegetables, and grains  Exercise, such as walking, is a good way to keep your bowels regular  Drink warm fluids, especially warm prune juice, or decaf coffee  Eat a 1/2 cup of real oatmeal (not instant), 1/2 cup applesauce, and 1/2-1 cup warm prune juice every day  If needed, you may take Colace (docusate sodium) stool softener once or twice a day to help keep the stool soft.   If you still are having problems with constipation, you may take Miralax once daily as needed to help keep your bowels regular.   Home  Blood Pressure Monitoring for Patients   Your provider has recommended that you check your blood pressure (BP) at least once a week at home. If you do not have a blood pressure cuff at home, one will be provided for you. Contact your provider if you have not received your monitor within 1 week.   Helpful Tips for Accurate Home Blood Pressure Checks  . Don't smoke, exercise, or drink caffeine 30 minutes before checking your BP . Use the restroom before checking your BP (a full bladder can raise your pressure) . Relax in a comfortable upright chair . Feet on the ground . Left arm resting comfortably on a flat surface at the level of your heart . Legs uncrossed . Back supported . Sit quietly and don't talk . Place the cuff on your bare arm . Adjust snuggly, so that only two fingertips can fit between your skin and the top of the cuff . Check 2 readings separated by at least one minute . Keep a log of your BP readings . For a visual, please reference this diagram: http://ccnc.care/bpdiagram  Provider Name: [facility]     Phone: [phone]  Zone 1: ALL CLEAR  Continue to monitor your symptoms:  . BP reading is less than 140 (top number) or less than 90 (bottom number)  . No right upper stomach pain .  No headaches or seeing spots . No feeling nauseated or throwing up . No swelling in face and hands  Zone 2: CAUTION Call your doctor's office for any of the following:  . BP reading is greater than 140 (top number) or greater than 90 (bottom number)  . Stomach pain under your ribs in the middle or right side . Headaches or seeing spots . Feeling nauseated or throwing up . Swelling in face and hands  Zone 3: EMERGENCY  Seek immediate medical care if you have any of the following:  . BP reading is greater than160 (top number) or greater than 110 (bottom number) . Severe headaches not improving with Tylenol . Serious difficulty catching your breath . Any worsening symptoms  from Zone 2    First Trimester of Pregnancy The first trimester of pregnancy is from week 1 until the end of week 12 (months 1 through 3). A week after a sperm fertilizes an egg, the egg will implant on the wall of the uterus. This embryo will begin to develop into a baby. Genes from you and your partner are forming the baby. The female genes determine whether the baby is a boy or a girl. At 6-8 weeks, the eyes and face are formed, and the heartbeat can be seen on ultrasound. At the end of 12 weeks, all the baby's organs are formed.  Now that you are pregnant, you will want to do everything you can to have a healthy baby. Two of the most important things are to get good prenatal care and to follow your health care provider's instructions. Prenatal care is all the medical care you receive before the baby's birth. This care will help prevent, find, and treat any problems during the pregnancy and childbirth. BODY CHANGES Your body goes through many changes during pregnancy. The changes vary from woman to woman.   You may gain or lose a couple of pounds at first.  You may feel sick to your stomach (nauseous) and throw up (vomit). If the vomiting is uncontrollable, call your health care provider.  You may tire easily.  You may develop headaches that can be relieved by medicines approved by your health care provider.  You may urinate more often. Painful urination may mean you have a bladder infection.  You may develop heartburn as a result of your pregnancy.  You may develop constipation because certain hormones are causing the muscles that push waste through your intestines to slow down.  You may develop hemorrhoids or swollen, bulging veins (varicose veins).  Your breasts may begin to grow larger and become tender. Your nipples may stick out more, and the tissue that surrounds them (areola) may become darker.  Your gums may bleed and may be sensitive to brushing and flossing.  Dark spots or  blotches (chloasma, mask of pregnancy) may develop on your face. This will likely fade after the baby is born.  Your menstrual periods will stop.  You may have a loss of appetite.  You may develop cravings for certain kinds of food.  You may have changes in your emotions from day to day, such as being excited to be pregnant or being concerned that something may go wrong with the pregnancy and baby.  You may have more vivid and strange dreams.  You may have changes in your hair. These can include thickening of your hair, rapid growth, and changes in texture. Some women also have hair loss during or after pregnancy, or hair that feels dry  or thin. Your hair will most likely return to normal after your baby is born. WHAT TO EXPECT AT YOUR PRENATAL VISITS During a routine prenatal visit:  You will be weighed to make sure you and the baby are growing normally.  Your blood pressure will be taken.  Your abdomen will be measured to track your baby's growth.  The fetal heartbeat will be listened to starting around week 10 or 12 of your pregnancy.  Test results from any previous visits will be discussed. Your health care provider may ask you:  How you are feeling.  If you are feeling the baby move.  If you have had any abnormal symptoms, such as leaking fluid, bleeding, severe headaches, or abdominal cramping.  If you have any questions. Other tests that may be performed during your first trimester include:  Blood tests to find your blood type and to check for the presence of any previous infections. They will also be used to check for low iron levels (anemia) and Rh antibodies. Later in the pregnancy, blood tests for diabetes will be done along with other tests if problems develop.  Urine tests to check for infections, diabetes, or protein in the urine.  An ultrasound to confirm the proper growth and development of the baby.  An amniocentesis to check for possible genetic  problems.  Fetal screens for spina bifida and Down syndrome.  You may need other tests to make sure you and the baby are doing well. HOME CARE INSTRUCTIONS  Medicines  Follow your health care provider's instructions regarding medicine use. Specific medicines may be either safe or unsafe to take during pregnancy.  Take your prenatal vitamins as directed.  If you develop constipation, try taking a stool softener if your health care provider approves. Diet  Eat regular, well-balanced meals. Choose a variety of foods, such as meat or vegetable-based protein, fish, milk and low-fat dairy products, vegetables, fruits, and whole grain breads and cereals. Your health care provider will help you determine the amount of weight gain that is right for you.  Avoid raw meat and uncooked cheese. These carry germs that can cause birth defects in the baby.  Eating four or five small meals rather than three large meals a day may help relieve nausea and vomiting. If you start to feel nauseous, eating a few soda crackers can be helpful. Drinking liquids between meals instead of during meals also seems to help nausea and vomiting.  If you develop constipation, eat more high-fiber foods, such as fresh vegetables or fruit and whole grains. Drink enough fluids to keep your urine clear or pale yellow. Activity and Exercise  Exercise only as directed by your health care provider. Exercising will help you:  Control your weight.  Stay in shape.  Be prepared for labor and delivery.  Experiencing pain or cramping in the lower abdomen or low back is a good sign that you should stop exercising. Check with your health care provider before continuing normal exercises.  Try to avoid standing for long periods of time. Move your legs often if you must stand in one place for a long time.  Avoid heavy lifting.  Wear low-heeled shoes, and practice good posture.  You may continue to have sex unless your health care  provider directs you otherwise. Relief of Pain or Discomfort  Wear a good support bra for breast tenderness.    Take warm sitz baths to soothe any pain or discomfort caused by hemorrhoids. Use hemorrhoid cream if your  health care provider approves.    Rest with your legs elevated if you have leg cramps or low back pain.  If you develop varicose veins in your legs, wear support hose. Elevate your feet for 15 minutes, 3-4 times a day. Limit salt in your diet. Prenatal Care  Schedule your prenatal visits by the twelfth week of pregnancy. They are usually scheduled monthly at first, then more often in the last 2 months before delivery.  Write down your questions. Take them to your prenatal visits.  Keep all your prenatal visits as directed by your health care provider. Safety  Wear your seat belt at all times when driving.  Make a list of emergency phone numbers, including numbers for family, friends, the hospital, and police and fire departments. General Tips  Ask your health care provider for a referral to a local prenatal education class. Begin classes no later than at the beginning of month 6 of your pregnancy.  Ask for help if you have counseling or nutritional needs during pregnancy. Your health care provider can offer advice or refer you to specialists for help with various needs.  Do not use hot tubs, steam rooms, or saunas.  Do not douche or use tampons or scented sanitary pads.  Do not cross your legs for long periods of time.  Avoid cat litter boxes and soil used by cats. These carry germs that can cause birth defects in the baby and possibly loss of the fetus by miscarriage or stillbirth.  Avoid all smoking, herbs, alcohol, and medicines not prescribed by your health care provider. Chemicals in these affect the formation and growth of the baby.  Schedule a dentist appointment. At home, brush your teeth with a soft toothbrush and be gentle when you floss. SEEK MEDICAL  CARE IF:   You have dizziness.  You have mild pelvic cramps, pelvic pressure, or nagging pain in the abdominal area.  You have persistent nausea, vomiting, or diarrhea.  You have a bad smelling vaginal discharge.  You have pain with urination.  You notice increased swelling in your face, hands, legs, or ankles. SEEK IMMEDIATE MEDICAL CARE IF:   You have a fever.  You are leaking fluid from your vagina.  You have spotting or bleeding from your vagina.  You have severe abdominal cramping or pain.  You have rapid weight gain or loss.  You vomit blood or material that looks like coffee grounds.  You are exposed to [place] measles and have never had them.  You are exposed to fifth disease or chickenpox.  You develop a severe headache.  You have shortness of breath.  You have any kind of trauma, such as from a fall or a car accident. Document Released: 08/25/2001 Document Revised: 01/15/2014 Document Reviewed: 07/11/2013 [facility] Patient Information 2015 [place], [place]. This information is not intended to replace advice given to you by your health care provider. Make sure you discuss any questions you have with your health care provider.  Coronavirus (COVID-19) Are you at risk?  Are you at risk for the Coronavirus (COVID-19)?  To be considered HIGH RISK for Coronavirus (COVID-19), you have to meet the following criteria:  . Traveled to [place], [place], [place], [place] or [place];  and have fever, cough, and shortness of breath within the last 2 weeks of travel OR . Been in close contact with a person diagnosed with COVID-19 within the last 2 weeks and have fever, cough, and shortness of breath . IF YOU DO NOT MEET  THESE CRITERIA, YOU ARE CONSIDERED LOW RISK FOR COVID-19.  What to do if you are HIGH RISK for COVID-19?  Marland Kitchen If you are having a medical emergency, call 911. . Seek medical care right away. Before you go to a doctor's office, urgent care or emergency department,  call ahead and tell them about your recent travel, contact with someone diagnosed with COVID-19, and your symptoms. You should receive instructions from your physician's office regarding next steps of care.  . When you arrive at healthcare provider, tell the healthcare staff immediately you have returned from visiting [place], [place], [place], [place] or [place]; in the last two weeks or you have been in close contact with a person diagnosed with COVID-19 in the last 2 weeks.   . Tell the health care staff about your symptoms: fever, cough and shortness of breath. . After you have been seen by a medical provider, you will be either: o Tested for (COVID-19) and discharged home on quarantine except to seek medical care if symptoms worsen, and asked to  - Stay home and avoid contact with others until you get your results (4-5 days)  - Avoid travel on public transportation if possible (such as bus, train, or airplane) or o Sent to the Emergency Department by EMS for evaluation, COVID-19 testing, and possible admission depending on your condition and test results.  What to do if you are LOW RISK for COVID-19?  Reduce your risk of any infection by using the same precautions used for avoiding the common cold or flu:  Marland Kitchen Wash your hands often with soap and warm water for at least 20 seconds.  If soap and water are not readily available, use an alcohol-based hand sanitizer with at least 60% alcohol.  . If coughing or sneezing, cover your mouth and nose by coughing or sneezing into the elbow areas of your shirt or coat, into a tissue or into your sleeve (not your hands). . Avoid shaking hands with others and consider head nods or verbal greetings only. . Avoid touching your eyes, nose, or mouth with unwashed hands.  . Avoid close contact with people who are sick. . Avoid places or events with large numbers of people in one location, like concerts or sporting events. . Carefully consider travel plans you have or  are making. . If you are planning any travel outside or inside the [place], visit the CDC's Travelers' Health webpage for the latest health notices. . If you have some symptoms but not all symptoms, continue to monitor at home and seek medical attention if your symptoms worsen. . If you are having a medical emergency, call 911.   [place] / e-Visit: [email or web address]         [facility]: [facility]: [phone]                   [facility]: [phone]     Safe Medications in Pregnancy   Acne: Benzoyl Peroxide Salicylic Acid  Backache/Headache: Tylenol: 2 regular strength every 4 hours OR              2 Extra strength every 6 hours  Colds/Coughs/Allergies: Benadryl (alcohol free) 25 mg every 6 hours as needed Breath right strips Claritin Cepacol throat lozenges Chloraseptic throat spray Cold-Eeze- up to three times per day Cough drops, alcohol free Flonase (by prescription only) Guaifenesin Mucinex Robitussin DM (plain only, alcohol free) Saline  nasal spray/drops Sudafed (pseudoephedrine) & Actifed ** use only after [redacted] weeks gestation and if you do not have high blood pressure Tylenol Vicks Vaporub Zinc lozenges Zyrtec   Constipation: Colace Ducolax suppositories Fleet enema Glycerin suppositories Metamucil Milk of magnesia Miralax Senokot Smooth move tea  Diarrhea: Kaopectate Imodium A-D  *NO pepto Bismol  Hemorrhoids: Anusol Anusol HC Preparation H Tucks  Indigestion: Tums Maalox Mylanta Zantac  Pepcid  Insomnia: Benadryl (alcohol free) 25mg  every 6 hours as needed Tylenol PM Unisom, no Gelcaps  Leg Cramps: Tums MagGel  Nausea/Vomiting:  Bonine Dramamine Emetrol Ginger extract Sea bands Meclizine  Nausea medication to take during pregnancy:  Unisom (doxylamine succinate  25 mg tablets) Take one tablet daily at bedtime. If symptoms are not adequately controlled, the dose can be increased to a maximum recommended dose of two tablets daily (1/2 tablet in the morning, 1/2 tablet mid-afternoon and one at bedtime). Vitamin B6 100mg  tablets. Take one tablet twice a day (up to 200 mg per day).  Skin Rashes: Aveeno products Benadryl cream or 25mg  every 6 hours as needed Calamine Lotion 1% cortisone cream  Yeast infection: Gyne-lotrimin 7 Monistat 7   **If taking multiple medications, please check labels to avoid duplicating the same active ingredients **take medication as directed on the label ** Do not exceed 4000 mg of tylenol in 24 hours **Do not take medications that contain aspirin or ibuprofen   Meet the Provider [place] for [place] is now offering FREE monthly 1-hour virtual Zoom sessions for new, current, and prospective patients.        During these sessions, you can:   Learn about our practice, model of care, services   Get answers to questions about pregnancy and birth during [place] your provider's brain about anything else!    Sessions will be hosted by [employer] for [employer], Engineer, materials, Physicians and Midwives          No registration required      2021 Dates:      All at 6pm     October 21st     November 18th   December 16th     January 20th  February 17th    To join one of these meetings, a few minutes before it is set to start:     Copy/paste the link into your web browser:  [email or web address]    OR  Scan the QR code below (open up your camera and point towards QR code; click on tab that pops up on your phone ("zoom")

## 2020-07-19 LAB — INTEGRATED 1

## 2020-07-19 LAB — CBC/D/PLT+RPR+RH+ABO+RUB AB...
HIV Screen 4th Generation wRfx: NONREACTIVE
MCH: 29.5 pg (ref 26.6–33.0)
MCHC: 34.4 g/dL (ref 31.5–35.7)
MCV: 86 fL (ref 79–97)
RPR Ser Ql: NONREACTIVE
Rubella Antibodies, IGG: 4.01 index (ref >0.99)

## 2020-07-19 LAB — COMPREHENSIVE METABOLIC PANEL
Alkaline Phosphatase: 97 IU/L (ref 44–121)
Bilirubin Total: 0.9 mg/dL (ref 0.0–1.2)
Chloride: 101 mmol/L (ref 96–106)
Creatinine, Ser: 0.53 mg/dL — ABNORMAL LOW (ref 0.57–1.00)
Total Protein: 7.8 g/dL (ref 6.0–8.5)

## 2020-07-19 LAB — PROTEIN / CREATININE RATIO, URINE: Protein/Creat Ratio: 89 mg/g creat (ref 0–200)

## 2020-07-20 LAB — HCV INTERPRETATION

## 2020-07-20 LAB — PMP SCREEN PROFILE (10S), URINE
Amphetamine Scrn, Ur: NEGATIVE ng/mL
BARBITURATE SCREEN URINE: NEGATIVE ng/mL
BENZODIAZEPINE SCREEN, URINE: NEGATIVE ng/mL
CANNABINOIDS UR QL SCN: NEGATIVE ng/mL
Cocaine (Metab) Scrn, Ur: NEGATIVE ng/mL
Creatinine(Crt), U: 55.6 mg/dL (ref 20.0–300.0)
Methadone Screen, Urine: NEGATIVE ng/mL
OXYCODONE+OXYMORPHONE UR QL SCN: NEGATIVE ng/mL
Opiate Scrn, Ur: NEGATIVE ng/mL
Ph of Urine: 7.1 (ref 4.5–8.9)
Phencyclidine Qn, Ur: NEGATIVE ng/mL
Propoxyphene Scrn, Ur: NEGATIVE ng/mL

## 2020-07-20 LAB — CBC/D/PLT+RPR+RH+ABO+RUB AB...
Antibody Screen: NEGATIVE
Basophils Absolute: 0.1 10*3/uL (ref 0.0–0.2)
Basos: 0 %
EOS (ABSOLUTE): 0.1 10*3/uL (ref 0.0–0.4)
Eos: 1 %
HCV Ab: <0.1 s/co ratio (ref 0.0–0.9)
Hematocrit: 41.6 % (ref 34.0–46.6)
Hemoglobin: 14.3 g/dL (ref 11.1–15.9)
Hepatitis B Surface Ag: NEGATIVE
Immature Grans (Abs): 0.1 10*3/uL (ref 0.0–0.1)
Immature Granulocytes: 1 %
Lymphocytes Absolute: 2.2 10*3/uL (ref 0.7–3.1)
Lymphs: 18 %
Monocytes Absolute: 0.6 10*3/uL (ref 0.1–0.9)
Monocytes: 5 %
Neutrophils Absolute: 8.8 10*3/uL — ABNORMAL HIGH (ref 1.4–7.0)
Neutrophils: 75 %
Platelets: 336 10*3/uL (ref 150–450)
RBC: 4.85 x10E6/uL (ref 3.77–5.28)
RDW: 13.7 % (ref 11.7–15.4)
Rh Factor: POSITIVE
WBC: 11.8 10*3/uL — ABNORMAL HIGH (ref 3.4–10.8)

## 2020-07-20 LAB — INTEGRATED 1
Gest. Age on Collection Date: 12.3 weeks
Maternal Age at EDD: 25.8 yr
Number of Fetuses: 1
PAPP-A Value: 813.5 ng/mL
Weight: 224 [lb_av]

## 2020-07-20 LAB — COMPREHENSIVE METABOLIC PANEL
ALT: 10 IU/L (ref 0–32)
AST: 14 IU/L (ref 0–40)
Albumin/Globulin Ratio: 1.7 (ref 1.2–2.2)
Albumin: 4.9 g/dL (ref 3.9–5.0)
BUN/Creatinine Ratio: 15 (ref 9–23)
BUN: 8 mg/dL (ref 6–20)
CO2: 20 mmol/L (ref 20–29)
Calcium: 10.1 mg/dL (ref 8.7–10.2)
GFR calc Af Amer: 153 mL/min/{1.73_m2} (ref >59)
GFR calc non Af Amer: 132 mL/min/{1.73_m2} (ref >59)
Globulin, Total: 2.9 g/dL (ref 1.5–4.5)
Glucose: 84 mg/dL (ref 65–99)
Potassium: 3.9 mmol/L (ref 3.5–5.2)
Sodium: 137 mmol/L (ref 134–144)

## 2020-07-20 LAB — GC/CHLAMYDIA PROBE AMP
Chlamydia trachomatis, NAA: NEGATIVE
Neisseria Gonorrhoeae by PCR: NEGATIVE

## 2020-07-20 LAB — HEMOGLOBIN A1C
Est. average glucose Bld gHb Est-mCnc: 111 mg/dL
Hgb A1c MFr Bld: 5.5 % (ref 4.8–5.6)

## 2020-07-20 LAB — PROTEIN / CREATININE RATIO, URINE
Creatinine, Urine: 46 mg/dL
Protein, Ur: 4.1 mg/dL

## 2020-07-21 LAB — URINE CULTURE

## 2020-08-14 ENCOUNTER — Other Ambulatory Visit: Payer: Self-pay | Admitting: Advanced Practice Midwife

## 2020-08-15 ENCOUNTER — Ambulatory Visit (INDEPENDENT_AMBULATORY_CARE_PROVIDER_SITE_OTHER): Payer: BC Managed Care – PPO | Admitting: Advanced Practice Midwife

## 2020-08-15 ENCOUNTER — Other Ambulatory Visit: Payer: Self-pay

## 2020-08-15 VITALS — BP 131/87 | HR 71 | Wt 218.0 lb

## 2020-08-15 DIAGNOSIS — Z363 Encounter for antenatal screening for malformations: Secondary | ICD-10-CM

## 2020-08-15 DIAGNOSIS — Z331 Pregnant state, incidental: Secondary | ICD-10-CM

## 2020-08-15 DIAGNOSIS — Z1379 Encounter for other screening for genetic and chromosomal anomalies: Secondary | ICD-10-CM

## 2020-08-15 DIAGNOSIS — I10 Essential (primary) hypertension: Secondary | ICD-10-CM | POA: Insufficient documentation

## 2020-08-15 DIAGNOSIS — Z1389 Encounter for screening for other disorder: Secondary | ICD-10-CM

## 2020-08-15 DIAGNOSIS — O10919 Unspecified pre-existing hypertension complicating pregnancy, unspecified trimester: Secondary | ICD-10-CM | POA: Insufficient documentation

## 2020-08-15 DIAGNOSIS — Z3A16 16 weeks gestation of pregnancy: Secondary | ICD-10-CM

## 2020-08-15 LAB — POCT URINALYSIS DIPSTICK OB
Blood, UA: NEGATIVE
Glucose, UA: NEGATIVE
Ketones, UA: NEGATIVE
Leukocytes, UA: NEGATIVE
Nitrite, UA: NEGATIVE

## 2020-08-15 NOTE — Patient Instructions (Signed)
Suzanne James, I greatly value your feedback.  If you receive a survey following your visit with [place] today, we appreciate you taking the time to fill it out.  Thanks, Cathie Beams, CNM     [facility] HAS MOVED!!! It is now [facility] at [facility] ([address] [place], [place] [address]) Entrance located off of [address] Free 24/7 valet parking   Go to [email or web address] to register for FREE online childbirth classes    Second Trimester of Pregnancy The second trimester is from week 14 through week 27 (months 4 through 6). The second trimester is often a time when you feel your best. Your body has adjusted to being pregnant, and you begin to feel better physically. Usually, morning sickness has lessened or quit completely, you may have more energy, and you may have an increase in appetite. The second trimester is also a time when the fetus is growing rapidly. At the end of the sixth month, the fetus is about 9 inches long and weighs about 1 pounds. You will likely begin to feel the baby move (quickening) between 16 and 20 weeks of pregnancy. Body changes during your second trimester Your body continues to go through many changes during your second trimester. The changes vary from woman to woman.  Your weight will continue to increase. You will notice your lower abdomen bulging out.  You may begin to get stretch marks on your hips, abdomen, and breasts.  You may develop headaches that can be relieved by medicines. The medicines should be approved by your health care provider.  You may urinate more often because the fetus is pressing on your bladder.  You may develop or continue to have heartburn as a result of your pregnancy.  You may develop constipation because certain hormones are causing the muscles that push waste through your intestines to slow down.  You may develop hemorrhoids or swollen, bulging veins (varicose veins).  You may have  back pain. This is caused by: ? Weight gain. ? Pregnancy hormones that are relaxing the joints in your pelvis. ? A shift in weight and the muscles that support your balance.  Your breasts will continue to grow and they will continue to become tender.  Your gums may bleed and may be sensitive to brushing and flossing.  Dark spots or blotches (chloasma, mask of pregnancy) may develop on your face. This will likely fade after the baby is born.  A dark line from your belly button to the pubic area (linea nigra) may appear. This will likely fade after the baby is born.  You may have changes in your hair. These can include thickening of your hair, rapid growth, and changes in texture. Some women also have hair loss during or after pregnancy, or hair that feels dry or thin. Your hair will most likely return to normal after your baby is born.  What to expect at prenatal visits During a routine prenatal visit:  You will be weighed to make sure you and the fetus are growing normally.  Your blood pressure will be taken.  Your abdomen will be measured to track your baby's growth.  The fetal heartbeat will be listened to.  Any test results from the previous visit will be discussed.  Your health care provider may ask you:  How you are feeling.  If you are feeling the baby move.  If you have had any abnormal symptoms, such as leaking fluid, bleeding, severe headaches, or abdominal  cramping.  If you are using any tobacco products, including cigarettes, chewing tobacco, and electronic cigarettes.  If you have any questions.  Other tests that may be performed during your second trimester include:  Blood tests that check for: ? Low iron levels (anemia). ? High blood sugar that affects pregnant women (gestational diabetes) between 65 and 28 weeks. ? Rh antibodies. This is to check for a protein on red blood cells (Rh factor).  Urine tests to check for infections, diabetes, or protein in  the urine.  An ultrasound to confirm the proper growth and development of the baby.  An amniocentesis to check for possible genetic problems.  Fetal screens for spina bifida and Down syndrome.  HIV (human immunodeficiency virus) testing. Routine prenatal testing includes screening for HIV, unless you choose not to have this test.  Follow these instructions at home: Medicines  Follow your health care provider's instructions regarding medicine use. Specific medicines may be either safe or unsafe to take during pregnancy.  Take a prenatal vitamin that contains at least 600 micrograms (mcg) of folic acid.  If you develop constipation, try taking a stool softener if your health care provider approves. Eating and drinking  Eat a balanced diet that includes fresh fruits and vegetables, whole grains, good sources of protein such as meat, eggs, or tofu, and low-fat dairy. Your health care provider will help you determine the amount of weight gain that is right for you.  Avoid raw meat and uncooked cheese. These carry germs that can cause birth defects in the baby.  If you have low calcium intake from food, talk to your health care provider about whether you should take a daily calcium supplement.  Limit foods that are high in fat and processed sugars, such as fried and sweet foods.  To prevent constipation: ? Drink enough fluid to keep your urine clear or pale yellow. ? Eat foods that are high in fiber, such as fresh fruits and vegetables, whole grains, and beans. Activity  Exercise only as directed by your health care provider. Most women can continue their usual exercise routine during pregnancy. Try to exercise for 30 minutes at least 5 days a week. Stop exercising if you experience uterine contractions.  Avoid heavy lifting, wear low heel shoes, and practice good posture.  A sexual relationship may be continued unless your health care provider directs you otherwise. Relieving pain  and discomfort  Wear a good support bra to prevent discomfort from breast tenderness.  Take warm sitz baths to soothe any pain or discomfort caused by hemorrhoids. Use hemorrhoid cream if your health care provider approves.  Rest with your legs elevated if you have leg cramps or low back pain.  If you develop varicose veins, wear support hose. Elevate your feet for 15 minutes, 3-4 times a day. Limit salt in your diet. Prenatal Care  Write down your questions. Take them to your prenatal visits.  Keep all your prenatal visits as told by your health care provider. This is important. Safety  Wear your seat belt at all times when driving.  Make a list of emergency phone numbers, including numbers for family, friends, the hospital, and police and fire departments. General instructions  Ask your health care provider for a referral to a local prenatal education class. Begin classes no later than the beginning of month 6 of your pregnancy.  Ask for help if you have counseling or nutritional needs during pregnancy. Your health care provider can offer advice or refer  you to specialists for help with various needs.  Do not use hot tubs, steam rooms, or saunas.  Do not douche or use tampons or scented sanitary pads.  Do not cross your legs for long periods of time.  Avoid cat litter boxes and soil used by cats. These carry germs that can cause birth defects in the baby and possibly loss of the fetus by miscarriage or stillbirth.  Avoid all smoking, herbs, alcohol, and unprescribed drugs. Chemicals in these products can affect the formation and growth of the baby.  Do not use any products that contain nicotine or tobacco, such as cigarettes and e-cigarettes. If you need help quitting, ask your health care provider.  Visit your dentist if you have not gone yet during your pregnancy. Use a soft toothbrush to brush your teeth and be gentle when you floss. Contact a health care provider  if:  You have dizziness.  You have mild pelvic cramps, pelvic pressure, or nagging pain in the abdominal area.  You have persistent nausea, vomiting, or diarrhea.  You have a bad smelling vaginal discharge.  You have pain when you urinate. Get help right away if:  You have a fever.  You are leaking fluid from your vagina.  You have spotting or bleeding from your vagina.  You have severe abdominal cramping or pain.  You have rapid weight gain or weight loss.  You have shortness of breath with chest pain.  You notice sudden or extreme swelling of your face, hands, ankles, feet, or legs.  You have not felt your baby move in over an hour.  You have severe headaches that do not go away when you take medicine.  You have vision changes. Summary  The second trimester is from week 14 through week 27 (months 4 through 6). It is also a time when the fetus is growing rapidly.  Your body goes through many changes during pregnancy. The changes vary from woman to woman.  Avoid all smoking, herbs, alcohol, and unprescribed drugs. These chemicals affect the formation and growth your baby.  Do not use any tobacco products, such as cigarettes, chewing tobacco, and e-cigarettes. If you need help quitting, ask your health care provider.  Contact your health care provider if you have any questions. Keep all prenatal visits as told by your health care provider. This is important. This information is not intended to replace advice given to you by your health care provider. Make sure you discuss any questions you have with your health care provider.

## 2020-08-15 NOTE — Progress Notes (Signed)
   LOW-RISK PREGNANCY VISIT Patient name: Suzanne James MRN [id number]  Date of birth: October 06, 1995 Chief Complaint:   Routine Prenatal Visit  History of Present Illness:   Suzanne James is a 24 y.o. G50P2002 female at [redacted]w[redacted]d with an Estimated Date of Delivery: 01/27/21 being seen today for ongoing management of a low-risk pregnancy.  Today she reports occasional contractions. Contractions: Irregular. Vag. Bleeding: None.  Movement: Present. denies leaking of fluid. Review of Systems:   Pertinent items are noted in HPI Denies abnormal vaginal discharge w/ itching/odor/irritation, headaches, visual changes, shortness of breath, chest pain, abdominal pain, severe nausea/vomiting, or problems with urination or bowel movements unless otherwise stated above. Pertinent History Reviewed:  Reviewed past medical,surgical, social, obstetrical and family history.  Reviewed problem list, medications and allergies.   Has several elevated BPs in [facility] that qualifies her for a dx of CHTN. All baseline labs already done.   Physical Assessment:   Vitals:   08/15/20 1119  BP: 131/87  Pulse: 71  Weight: 218 lb (98.9 kg)  Body mass index is 38.62 kg/m.        Physical Examination:   General appearance: Well appearing, and in no distress  Mental status: Alert, oriented to person, place, and time  Skin: Warm & dry  Cardiovascular: Normal heart rate noted  Respiratory: Normal respiratory effort, no distress  Abdomen: Soft, gravid, nontender  Pelvic: Cervical exam deferred         Extremities: Edema: Trace  Fetal Status:     Movement: Present    Chaperone: n/a    Results for orders placed or performed in visit on 08/15/20 (from the past 24 hour(s))  POC Urinalysis Dipstick OB   Collection Time: 08/15/20 11:18 AM  Result Value Ref Range   Color, UA     Clarity, UA     Glucose, UA Negative Negative   Bilirubin, UA     Ketones, UA neg    Spec Grav, UA     Blood, UA neg    pH, UA      POC,PROTEIN,UA Trace Negative, Trace, Small (1+), Moderate (2+), Large (3+), 4+   Urobilinogen, UA     Nitrite, UA neg    Leukocytes, UA Negative Negative   Appearance     Odor      Assessment & Plan:  1) Low-risk pregnancy G3P2002 at [redacted]w[redacted]d with an Estimated Date of Delivery: 01/27/21   2) CHTN, no meds:  Baseline labs:___  Baby ASA 162mg  @ 12wks [x]   Growth u/s @ 20, 24, 28, 33, 36wks     2x/wk testing nst/sono @ 36wks   Deliver @ 39wks:______   Meds: No orders of the defined types were placed in this encounter.  Labs/procedures today: 2nd IT  Plan:  Continue routine obstetrical care  Next visit: prefers will be in person for anatomy scan    Reviewed: Preterm labor symptoms and general obstetric precautions including but not limited to vaginal bleeding, contractions, leaking of fluid and fetal movement were reviewed in detail with the patient.  All questions were answered. Given home bp cuff.. Check bp weekly, let know if >140/90.   Follow-up: No follow-ups on file.  Orders Placed This Encounter  Procedures  . INTEGRATED 2  . POC Urinalysis Dipstick OB   DNP, CNM 08/15/2020 11:28 AM

## 2020-08-17 LAB — INTEGRATED 2
AFP MoM: 1.36
Alpha-Fetoprotein: 32.7 ng/mL
Crown Rump Length: 60.1 mm
DIA MoM: 0.98
DIA Value: 123 pg/mL
Estriol, Unconjugated: 1.81 ng/mL
Gest. Age on Collection Date: 12.3 weeks
Gestational Age: 16.3 weeks
Maternal Age at EDD: 25.8 yr
Nuchal Translucency (NT): 2.3 mm
Nuchal Translucency MoM: 1.72
Number of Fetuses: 1
PAPP-A MoM: 1.45
PAPP-A Value: 813.5 ng/mL
Test Results:: NEGATIVE
Weight: 224 [lb_av]
Weight: 224 [lb_av]
hCG MoM: 1.33
hCG Value: 34.4 IU/mL
uE3 MoM: 2.05

## 2020-09-14 NOTE — L&D Delivery Note (Signed)
Delivery Note Suzanne James is a 25 y.o. G3P2002 at [redacted]w[redacted]d admitted for IOL d/t PPROM.   GBS Status: pending, tx adequately w/ PCN d/t prematurity   Maximum Maternal Temperature: 98.7  Labor course: Initial SVE: cl/th/-3. Augmentation with: Pitocin, Cytotec, IP Foley and AROM of forebag. She then progressed to complete.  ROM: 81h 57m with clear fluid  Birth: AROM forebag @ 2030, quickly progressed and had strong urge to push, and at 2151 a viable female was delivered via spontaneous vaginal delivery (Presentation: direct OP). Nuchal cord present: No.  Shoulders and body delivered in usual fashion. Vigorous infant placed directly on mom's abdomen for bonding/skin-to-skin, baby dried and stimulated. Cord clamped x 2 after 1 minute and cut by FOB, then taken to radiant warmer for evaluation by NICU team called to be in attendance d/t prematurity.  Cord blood collected.  The placenta separated spontaneously and delivered via gentle cord traction.  Pitocin infused rapidly IV per protocol.  Fundus boggy w/ steady stream, cytotec buccal and rectal given. Firmed briefly, then boggy again, TXA given IV, and fentanyl IV given for uterine sweep of mod amt clots, which resulted in nice firming of uterus and normal lochia.  Placenta inspected and appears to be intact with a 3 VC.  Placenta/Cord with the following complications: marginal insertion .  Cord pH: not done Sponge and instrument count were correct x2.  Intrapartum complications:  Postpartum Hemorrhage Anesthesia:  IV Fentanyl Episiotomy: none Lacerations:  none Suture Repair: n/a EBL (mL): 700   Infant: APGAR (1 MIN):  See NICU notes APGAR (5 MINS):   APGAR (10 MINS):    Infant weight: pending  Mom to postpartum.  Baby to NICU. Placenta to Pathology for PPROM   Plans to Breastfeed Contraception: oral progesterone-only contraceptive Circumcision: N/A  Note sent to [facility]: FT for pp visit.  Cheral Marker CNM,  [facility] 12/17/2020 10:23 PM

## 2020-09-16 ENCOUNTER — Encounter: Payer: Self-pay | Admitting: Obstetrics & Gynecology

## 2020-09-16 ENCOUNTER — Ambulatory Visit (INDEPENDENT_AMBULATORY_CARE_PROVIDER_SITE_OTHER): Payer: BC Managed Care – PPO | Admitting: Obstetrics & Gynecology

## 2020-09-16 ENCOUNTER — Ambulatory Visit (INDEPENDENT_AMBULATORY_CARE_PROVIDER_SITE_OTHER): Payer: BC Managed Care – PPO

## 2020-09-16 ENCOUNTER — Other Ambulatory Visit: Payer: Self-pay

## 2020-09-16 VITALS — BP 131/83 | HR 92 | Wt 217.0 lb

## 2020-09-16 DIAGNOSIS — Z363 Encounter for antenatal screening for malformations: Secondary | ICD-10-CM

## 2020-09-16 DIAGNOSIS — Z3480 Encounter for supervision of other normal pregnancy, unspecified trimester: Secondary | ICD-10-CM

## 2020-09-16 DIAGNOSIS — Z3A21 21 weeks gestation of pregnancy: Secondary | ICD-10-CM

## 2020-09-16 DIAGNOSIS — Z331 Pregnant state, incidental: Secondary | ICD-10-CM

## 2020-09-16 DIAGNOSIS — Z1389 Encounter for screening for other disorder: Secondary | ICD-10-CM

## 2020-09-16 LAB — POCT URINALYSIS DIPSTICK OB
Blood, UA: NEGATIVE
Glucose, UA: NEGATIVE
Ketones, UA: NEGATIVE
Leukocytes, UA: NEGATIVE
Nitrite, UA: NEGATIVE
POC,PROTEIN,UA: NEGATIVE

## 2020-09-16 NOTE — Progress Notes (Signed)
HIGH-RISK PREGNANCY VISIT Patient name: Suzanne James MRN [id number]  Date of birth: Jun 12, 1996 Chief Complaint:   Routine Prenatal Visit  History of Present Illness:   Suzanne James is a 25 y.o. G69P2002 female at [redacted]w[redacted]d with an Estimated Date of Delivery: 01/27/21 being seen today for ongoing management of a high-risk pregnancy complicated by chronic hypertension currently on no meds.  Today she reports no complaints. Pelvic pressure lower back Depression screen [facility] 2/9 07/18/2020 06/14/2020  Decreased Interest 1 0  Down, Depressed, Hopeless 0 0  PHQ - 2 Score 1 0  Altered sleeping 3 0  Tired, decreased energy 2 0  Change in appetite 3 0  Feeling bad or failure about yourself  0 0  Trouble concentrating 2 0  Moving slowly or fidgety/restless 0 0  Suicidal thoughts 0 0  PHQ-9 Score 11 0  Difficult doing work/chores - Not difficult at all    Contractions: Irritability. Vag. Bleeding: None.  Movement: Present. denies leaking of fluid.  Review of Systems:   Pertinent items are noted in HPI Denies abnormal vaginal discharge w/ itching/odor/irritation, headaches, visual changes, shortness of breath, chest pain, abdominal pain, severe nausea/vomiting, or problems with urination or bowel movements unless otherwise stated above. Pertinent History Reviewed:  Reviewed past medical,surgical, social, obstetrical and family history.  Reviewed problem list, medications and allergies. Physical Assessment:   Vitals:   09/16/20 1514  BP: 131/83  Pulse: 92  Weight: 217 lb (98.4 kg)  Body mass index is 38.44 kg/m.           Physical Examination:   General appearance: alert, well appearing, and in no distress  Mental status: alert, oriented to person, place, and time  Skin: warm & dry   Extremities: Edema: Trace    Cardiovascular: normal heart rate noted  Respiratory: normal respiratory effort, no distress  Abdomen: gravid, soft, non-tender  Pelvic: Cervical exam deferred         Fetal  Status: Fetal Heart Rate (bpm): 166   Movement: Present    Fetal Surveillance Testing today: sonogram   Chaperone: N/A    Results for orders placed or performed in visit on 09/16/20 (from the past 24 hour(s))  POC Urinalysis Dipstick OB   Collection Time: 09/16/20  3:13 PM  Result Value Ref Range   Color, UA     Clarity, UA     Glucose, UA Negative Negative   Bilirubin, UA     Ketones, UA neg    Spec Grav, UA     Blood, UA neg    pH, UA     POC,PROTEIN,UA Negative Negative, Trace, Small (1+), Moderate (2+), Large (3+), 4+   Urobilinogen, UA     Nitrite, UA neg    Leukocytes, UA Negative Negative   Appearance     Odor      Assessment & Plan:  High-risk pregnancy: G3P2002 at [redacted]w[redacted]d with an Estimated Date of Delivery: 01/27/21   1) chronic hypertension, stable, no meds  2) Hx of GHTN, FGR, GDM,   Meds: No orders of the defined types were placed in this encounter.   Labs/procedures today: sonogram is normal  Treatment Plan:  Per protocol for CHTN, growth scan 28 weeks w/PN2  Reviewed: Preterm labor symptoms and general obstetric precautions including but not limited to vaginal bleeding, contractions, leaking of fluid and fetal movement were reviewed in detail with the patient.  All questions were answered. Has home bp cuff. Rx faxed to . Check bp weekly, let  us know if >140/90.   Follow-up: Return in about 4 weeks (around 10/14/2020) for HROB.   No future appointments.  Orders Placed This Encounter  Procedures  . POC Urinalysis Dipstick OB   Lazaro Arms  09/16/2020 3:44 PM

## 2020-09-16 NOTE — Progress Notes (Addendum)
[place] 21 wks,breech,fhr 166 bpm,cx 4 cm,posterior placenta gr 0,svp of fluid 4.3 cm,EFW 420 g 66%,small stomach bubble (resoloved),anatomy complete,no obvious abnormalities

## 2020-09-18 ENCOUNTER — Other Ambulatory Visit: Payer: Self-pay | Admitting: *Deleted

## 2020-09-18 DIAGNOSIS — Z3480 Encounter for supervision of other normal pregnancy, unspecified trimester: Secondary | ICD-10-CM

## 2020-09-23 ENCOUNTER — Telehealth: Payer: Self-pay | Admitting: Women's Health

## 2020-09-23 NOTE — Telephone Encounter (Signed)
Tiffany w/[employer] called to report elevated BP 140/88 with nausea & headache

## 2020-09-30 ENCOUNTER — Emergency Department (HOSPITAL_COMMUNITY)
Admission: EM | Admit: 2020-09-30 | Discharge: 2020-09-30 | Disposition: A | Discharge status: Home / Self Care | Payer: BC Managed Care – PPO | Attending: Emergency Medicine | Admitting: Emergency Medicine

## 2020-09-30 ENCOUNTER — Encounter (HOSPITAL_COMMUNITY): Payer: Self-pay

## 2020-09-30 ENCOUNTER — Other Ambulatory Visit: Payer: Self-pay

## 2020-09-30 DIAGNOSIS — U071 COVID-19: Secondary | ICD-10-CM | POA: Diagnosis not present

## 2020-09-30 DIAGNOSIS — Z7982 Long term (current) use of aspirin: Secondary | ICD-10-CM | POA: Insufficient documentation

## 2020-09-30 DIAGNOSIS — Z3A23 23 weeks gestation of pregnancy: Secondary | ICD-10-CM | POA: Diagnosis not present

## 2020-09-30 DIAGNOSIS — O10012 Pre-existing essential hypertension complicating pregnancy, second trimester: Secondary | ICD-10-CM | POA: Diagnosis not present

## 2020-09-30 DIAGNOSIS — R000 Tachycardia, unspecified: Secondary | ICD-10-CM | POA: Diagnosis not present

## 2020-09-30 DIAGNOSIS — O26892 Other specified pregnancy related conditions, second trimester: Secondary | ICD-10-CM | POA: Diagnosis not present

## 2020-09-30 DIAGNOSIS — O98512 Other viral diseases complicating pregnancy, second trimester: Secondary | ICD-10-CM | POA: Insufficient documentation

## 2020-09-30 NOTE — ED Triage Notes (Signed)
Pt reports that she is covid+ today , pt reports fevers, body aches, [redacted] weeks pregnant, G3P2

## 2020-09-30 NOTE — ED Provider Notes (Signed)
[facility] EMERGENCY DEPARTMENT Provider Note   CSN: [id number] Arrival date & time: 09/30/20  2216     History Chief Complaint  Patient presents with  . Covid Positive    Suzanne James is a 25 y.o. female.  HPI She presents for evaluation of high blood pressure and positive COVID test today. She checked her blood pressure at home and it was 140/90. She called her obstetrician who directed her here for evaluation. She reports having a fever and backache for 2 days. She is currently being treated for hypertension during pregnancy. Her pregnancy is at 23 weeks, currently she denies other pregnancy complications at this time. There are no other known modifying factors.  Past Medical History:  Diagnosis Date  . Strep throat     Patient Active Problem List   Diagnosis Date Noted  . Benign essential hypertension 08/15/2020  . History of gestational diabetes in prior pregnancy, currently pregnant 07/16/2020  . History of gestational hypertension 07/16/2020  . Encounter for supervision of normal pregnancy, antepartum 07/16/2020  . Anxiety 06/14/2020    Past Surgical History:  Procedure Laterality Date  . TONSILLECTOMY       OB History    Gravida  3   Para  2   Term  2   Preterm      AB      Living  2     SAB      IAB      Ectopic      Multiple      Live Births  2           Family History  Problem Relation Age of Onset  . Asthma Sister   . Hypertension Sister   . Heart attack Maternal Grandmother   . Hypertension Paternal Grandmother   . Diabetes Paternal Grandmother     Social History   Tobacco Use  . Smoking status: Never Smoker  . Smokeless tobacco: Never Used  Vaping Use  . Vaping Use: Never used  Substance Use Topics  . Alcohol use: No  . Drug use: No    Home Medications Prior to Admission medications   Medication Sig Start Date End Date Taking? Authorizing Provider  aspirin EC 81 MG tablet Take 2 tablets (162  mg total) by mouth daily. 07/18/20   Cresenzo-Dishmon, Scarlette Calico, CNM  omeprazole (PRILOSEC) 20 MG capsule Take 20 mg by mouth daily.    [provider]  ondansetron (ZOFRAN ODT) 4 MG disintegrating tablet Take 1 tablet (4 mg total) by mouth every 6 (six) hours as needed for nausea. 07/18/20   Cresenzo-Dishmon, Scarlette Calico, CNM  Prenatal Vit-Fe Fumarate-FA (MULTIVITAMIN-PRENATAL) 27-0.8 MG TABS tablet Take 1 tablet by mouth daily at 12 noon.    [provider]  sertraline (ZOLOFT) 25 MG tablet TAKE 1 TABLET BY MOUTH EVERY DAY 08/15/20   Cresenzo-Dishmon, Scarlette Calico, CNM  Norgestimate-Ethinyl Estradiol Triphasic (TRI-SPRINTEC) 0.18/0.215/0.25 MG-35 MCG tablet Take 1 tablet by mouth daily.  03/24/13  [provider]    Allergies    Patient has no known allergies.  Review of Systems   Review of Systems  All other systems reviewed and are negative.   Physical Exam Updated Vital Signs BP 125/73 (BP Location: Right Arm)   Pulse (!) 104   Temp 97.9 F (36.6 C) (Oral)   Resp 16   LMP 04/14/2020   SpO2 100%   Physical Exam Vitals and nursing note reviewed.  Constitutional:      Appearance: She  is well-developed and well-nourished.  HENT:     Head: Normocephalic and atraumatic.  Eyes:     Extraocular Movements: EOM normal.     Conjunctiva/sclera: Conjunctivae normal.     Pupils: Pupils are equal, round, and reactive to light.  Neck:     Trachea: Phonation normal.  Cardiovascular:     Rate and Rhythm: Regular rhythm. Tachycardia present.  Pulmonary:     Effort: Pulmonary effort is normal. No respiratory distress.     Breath sounds: No stridor.  Musculoskeletal:        General: Normal range of motion.     Cervical back: Normal range of motion and neck supple.  Skin:    General: Skin is warm and dry.  Neurological:     Mental Status: She is alert and oriented to person, place, and time.     Motor: No abnormal muscle tone.  Psychiatric:        Mood and Affect: Mood  and affect and mood normal.        Behavior: Behavior normal.        Thought Content: Thought content normal.        Judgment: Judgment normal.     ED Results / Procedures / Treatments   Labs (all labs ordered are listed, but only abnormal results are displayed) Labs Reviewed - No data to display  EKG None  Radiology No results found.  Procedures Procedures (including critical care time)  Medications Ordered in ED Medications - No data to display  ED Course  I have reviewed the triage vital signs and the nursing notes.  Pertinent labs & imaging results that were available during my care of the patient were reviewed by me and considered in my medical decision making (see chart for details).    MDM Rules/Calculators/A&P                           Patient Vitals for the past 24 hrs:  BP Temp Temp src Pulse Resp SpO2  09/30/20 2223 125/73 97.9 F (36.6 C) Oral (!) 104 16 100 %    11:09 PM Reevaluation with update and discussion. After initial assessment and treatment, an updated evaluation reveals no change in clinical status, I have discussed the case with Roma Schanz, nurse practitioner provider on-call, at the [facility].Marland Kitchen Mancel Bale   Medical Decision Making:  This patient is presenting for evaluation of COVID infection, which does require a range of treatment options, and is a complaint that involves a moderate risk of morbidity and mortality. The differential diagnoses include COVID hypoxia, pregnancy complication. I decided to review old records, and in summary patient with 23-week pregnancy presenting with COVID symptoms since yesterday and positive COVID test today..  I did not require additional historical information from anyone.    Critical Interventions-clinical evaluation, discussion with on-call OB/gynecology provider  After These Interventions, the Patient was reevaluated and was found stable for discharge. No indication for further evaluation at this  time. Stable for discharge to follow-up with OB expectantly, and is scheduled  CRITICAL CARE-no Performed by: Mancel Bale  Nursing Notes Reviewed/ Care Coordinated Applicable Imaging Reviewed Interpretation of Laboratory Data incorporated into ED treatment  The patient appears reasonably screened and/or stabilized for discharge and I doubt any other medical condition or other [facility] requiring further screening, evaluation, or treatment in the ED at this time prior to discharge.  Plan: Home Medications-symptomatic care for COVID, continue usual; Home Treatments-rest,  fluids; return here if the recommended treatment, does not improve the symptoms; Recommended follow up-OBS scheduled, as needed     Final Clinical Impression(s) / ED Diagnoses Final diagnoses:  COVID-19  [redacted] weeks gestation of pregnancy    Rx / DC Orders ED Discharge Orders         Ordered    Ambulatory referral for Covid Treatment        09/30/20 2308           Mancel Bale, MD 09/30/20 2312

## 2020-09-30 NOTE — Discharge Instructions (Addendum)
Isolate yourself from everyone else for at least 5 days plus longer if you still have symptoms. Use Tylenol for fever, Robitussin-DM for cough. Follow-up with your obstetrician as needed. Tell them your blood pressure was normal today when we checked it.

## 2020-10-01 ENCOUNTER — Telehealth: Payer: Self-pay

## 2020-10-01 ENCOUNTER — Other Ambulatory Visit (HOSPITAL_COMMUNITY): Payer: Self-pay | Admitting: Internal Medicine

## 2020-10-01 ENCOUNTER — Telehealth: Payer: Self-pay | Admitting: Advanced Practice Midwife

## 2020-10-01 ENCOUNTER — Telehealth: Payer: Self-pay | Admitting: Physician Assistant

## 2020-10-01 DIAGNOSIS — U071 COVID-19: Secondary | ICD-10-CM

## 2020-10-01 NOTE — Telephone Encounter (Signed)
Pt went to ER last night for bp and is covid +. BP normal in ER.

## 2020-10-01 NOTE — Telephone Encounter (Signed)
Tested positive for Covid yesterday. Wants to know if the immuno therapy is safe for her to have.

## 2020-10-01 NOTE — Telephone Encounter (Signed)
Called to discuss with patient about COVID-19 symptoms and the use of one of the available treatments for those with mild to moderate Covid symptoms and at a high risk of hospitalization.  Pt appears to qualify for outpatient treatment due to co-morbid conditions and/or a member of an at-risk group in accordance with the FDA Emergency Use Authorization.   Qualifiers: Pregnancy   Unable to reach pt. Left voice mail and sent my chart message.   Suzanne James - C

## 2020-10-01 NOTE — Telephone Encounter (Signed)
Tiffany from [facility] called to report  BP of 150/90 and headaches & abnormal vision. Clinical Staff will follow up with patient.

## 2020-10-01 NOTE — Progress Notes (Signed)
I connected by phone with Suzanne James on 10/01/2020 at 4:47 PM to discuss the potential use of a new treatment for mild to moderate COVID-19 viral infection in non-hospitalized patients.  This patient is a 25 y.o. female that meets the FDA criteria for Emergency Use Authorization of COVID monoclonal antibody sotrovimab.  Has a (+) direct SARS-CoV-2 viral test result  Has mild or moderate COVID-19   Is NOT hospitalized due to COVID-19  Is within 10 days of symptom onset  Has at least one of the high risk factor(s) for progression to severe COVID-19 and/or hospitalization as defined in EUA.  Specific high risk criteria : Pregnancy   I have spoken and communicated the following to the patient or parent/caregiver regarding COVID monoclonal antibody treatment:  1. FDA has authorized the emergency use for the treatment of mild to moderate COVID-19 in adults and pediatric patients with positive results of direct SARS-CoV-2 viral testing who are 33 years of age and older weighing at least 40 kg, and who are at high risk for progressing to severe COVID-19 and/or hospitalization.  2. The significant known and potential risks and benefits of COVID monoclonal antibody, and the extent to which such potential risks and benefits are unknown.  3. Information on available alternative treatments and the risks and benefits of those alternatives, including clinical trials.  4. Patients treated with COVID monoclonal antibody should continue to self-isolate and use infection control measures (e.g., wear mask, isolate, social distance, avoid sharing personal items, clean and disinfect "high touch" surfaces, and frequent handwashing) according to CDC guidelines.   5. The patient or parent/caregiver has the option to accept or refuse COVID monoclonal antibody treatment.  After reviewing this information with the patient, the patient has agreed to receive one of the available covid 19 monoclonal antibodies and  will be provided an appropriate fact sheet prior to infusion.   Symptom onset 1/15. MAB infusion scheduled for 1/19 at 1330  Cyndee Brightly, NP [facility]

## 2020-10-02 ENCOUNTER — Ambulatory Visit (HOSPITAL_COMMUNITY)
Admission: RE | Admit: 2020-10-02 | Discharge: 2020-10-02 | Disposition: A | Discharge status: Home / Self Care | Payer: BC Managed Care – PPO | Source: Ambulatory Visit | Attending: Pulmonary Disease | Admitting: Pulmonary Disease

## 2020-10-02 ENCOUNTER — Encounter: Payer: Self-pay | Admitting: Family

## 2020-10-02 DIAGNOSIS — U071 COVID-19: Secondary | ICD-10-CM | POA: Insufficient documentation

## 2020-10-02 MED ORDER — SODIUM CHLORIDE 0.9 % IV SOLN: INTRAVENOUS | Status: DC | PRN

## 2020-10-02 MED ORDER — FAMOTIDINE IN NACL 20-0.9 MG/50ML-% IV SOLN: 20.0000 mg | Freq: Once | INTRAVENOUS | Status: DC | PRN

## 2020-10-02 MED ORDER — ALBUTEROL SULFATE HFA 108 (90 BASE) MCG/ACT IN AERS: 2.0000 | INHALATION_SPRAY | Freq: Once | RESPIRATORY_TRACT | Status: DC | PRN

## 2020-10-02 MED ORDER — METHYLPREDNISOLONE SODIUM SUCC 125 MG IJ SOLR: 125.0000 mg | Freq: Once | INTRAMUSCULAR | Status: DC | PRN

## 2020-10-02 MED ORDER — DIPHENHYDRAMINE HCL 50 MG/ML IJ SOLN: 50.0000 mg | Freq: Once | INTRAMUSCULAR | Status: DC | PRN

## 2020-10-02 MED ORDER — EPINEPHRINE 0.3 MG/0.3ML IJ SOAJ: 0.3000 mg | Freq: Once | INTRAMUSCULAR | Status: DC | PRN

## 2020-10-02 MED ORDER — SOTROVIMAB 500 MG/8ML IV SOLN
500.0000 mg | Freq: Once | INTRAVENOUS | Status: AC
  Administered 2020-10-02: 500 mg via INTRAVENOUS

## 2020-10-02 NOTE — Progress Notes (Signed)
Diagnosis: COVID-19  Physician: Dr. Patrick Wright  Procedure: Covid Infusion Clinic Med: Sotrovimab infusion - Provided patient with sotrovimab fact sheet for patients, parents, and caregivers prior to infusion.   Complications: No immediate complications noted  Discharge: Discharged home    

## 2020-10-02 NOTE — Progress Notes (Signed)
Patient reviewed Fact Sheet for Patients, Parents, and Caregivers for Emergency Use Authorization (EUA) of sotrovimab for the Treatment of Coronavirus. Patient also reviewed and is agreeable to the estimated cost of treatment. Patient is agreeable to proceed.   

## 2020-10-02 NOTE — Discharge Instructions (Signed)

## 2020-10-14 ENCOUNTER — Encounter: Payer: Self-pay | Admitting: Women's Health

## 2020-10-14 ENCOUNTER — Ambulatory Visit (INDEPENDENT_AMBULATORY_CARE_PROVIDER_SITE_OTHER): Payer: BC Managed Care – PPO | Admitting: Women's Health

## 2020-10-14 ENCOUNTER — Other Ambulatory Visit: Payer: Self-pay

## 2020-10-14 VITALS — BP 125/78 | HR 81 | Wt 215.8 lb

## 2020-10-14 DIAGNOSIS — Z3A25 25 weeks gestation of pregnancy: Secondary | ICD-10-CM

## 2020-10-14 DIAGNOSIS — Z1389 Encounter for screening for other disorder: Secondary | ICD-10-CM

## 2020-10-14 DIAGNOSIS — Z3480 Encounter for supervision of other normal pregnancy, unspecified trimester: Secondary | ICD-10-CM

## 2020-10-14 DIAGNOSIS — O10919 Unspecified pre-existing hypertension complicating pregnancy, unspecified trimester: Secondary | ICD-10-CM

## 2020-10-14 DIAGNOSIS — O0992 Supervision of high risk pregnancy, unspecified, second trimester: Secondary | ICD-10-CM

## 2020-10-14 LAB — POCT URINALYSIS DIPSTICK OB
Blood, UA: NEGATIVE
Glucose, UA: NEGATIVE
Ketones, UA: NEGATIVE
Leukocytes, UA: NEGATIVE
Nitrite, UA: NEGATIVE

## 2020-10-14 NOTE — Patient Instructions (Signed)
Suzanne James, I greatly value your feedback.  If you receive a survey following your visit with [place] today, we appreciate you taking the time to fill it out.  Thanks, Joellyn Haff, CNM, WHNP-BC   You will have your sugar test next visit.  Please do not eat or drink anything after midnight the night before you come, not even water.  You will be here for at least two hours.  Please make an appointment online for the bloodwork at [email or web address] for 8:30am (or as close to this as possible). Make sure you select the [facility] service center. The day of the appointment, check in with our office first, then you will go to [facility] to start the sugar test.    [facility] at [facility] [place], [place] [address]) Entrance C, located off of [address] valet parking  Go to [email or web address] to register for FREE online childbirth classes   Call the office [phone]) or go to [facility] if:  You begin to have strong, frequent contractions  Your water breaks.  Sometimes it is a big gush of fluid, sometimes it is just a trickle that keeps getting your panties wet or running down your legs  You have vaginal bleeding.  It is normal to have a small amount of spotting if your cervix was checked.   You don't feel your baby moving like normal.  If you don't, get you something to eat and drink and lay down and focus on feeling your baby move.   If your baby is still not moving like normal, you should call the office or go to [facility].  [place] Pediatricians/Family Doctors:  [facility] [phone]            [facility] [phone]                 [facility] [phone] (usually not accepting new patients unless you have family there already, you are always welcome to call and ask)       [facility] Department [phone]       [facility] Pediatricians/Family Doctors:   [facility]:  [phone]  [facility]: [phone]  [facility]: [phone]  [facility] Doctors:   [facility]: [phone]   [facility]: [phone]  [facility] Doctors:  [facility]: [phone]   Home Blood Pressure Monitoring for Patients   Your provider has recommended that you check your blood pressure (BP) at least once a week at home. If you do not have a blood pressure cuff at home, one will be provided for you. Contact your provider if you have not received your monitor within 1 week.   Helpful Tips for Accurate Home Blood Pressure Checks  . Don't smoke, exercise, or drink caffeine 30 minutes before checking your BP . Use the restroom before checking your BP (a full bladder can raise your pressure) . Relax in a comfortable upright chair . Feet on the ground . Left arm resting comfortably on a flat surface at the level of your heart . Legs uncrossed . Back supported . Sit quietly and don't talk . Place the cuff on your bare arm . Adjust snuggly, so that only two fingertips can fit between your skin and the top of the cuff . Check 2 readings separated by at least one minute . Keep a log of your BP readings . For a visual, please reference  this diagram: http://ccnc.care/bpdiagram  Provider Name: [facility]     Phone: [phone]  Zone 1: ALL CLEAR  Continue to monitor your symptoms:  . BP reading is less than 140 (top number) or less than 90 (bottom number)  . No right upper stomach pain . No headaches or seeing spots . No feeling nauseated or throwing up . No swelling in face and hands  Zone 2: CAUTION Call your doctor's office for any of the following:  . BP reading is greater than 140 (top number) or greater than 90 (bottom number)  . Stomach pain under your ribs in the middle or right side . Headaches or seeing spots . Feeling nauseated or throwing up . Swelling in  face and hands  Zone 3: EMERGENCY  Seek immediate medical care if you have any of the following:  . BP reading is greater than160 (top number) or greater than 110 (bottom number) . Severe headaches not improving with Tylenol . Serious difficulty catching your breath . Any worsening symptoms from Zone 2   Second Trimester of Pregnancy The second trimester is from week 13 through week 28, months 4 through 6. The second trimester is often a time when you feel your best. Your body has also adjusted to being pregnant, and you begin to feel better physically. Usually, morning sickness has lessened or quit completely, you may have more energy, and you may have an increase in appetite. The second trimester is also a time when the fetus is growing rapidly. At the end of the sixth month, the fetus is about 9 inches long and weighs about 1 pounds. You will likely begin to feel the baby move (quickening) between 18 and 20 weeks of the pregnancy. BODY CHANGES Your body goes through many changes during pregnancy. The changes vary from woman to woman.   Your weight will continue to increase. You will notice your lower abdomen bulging out.  You may begin to get stretch marks on your hips, abdomen, and breasts.  You may develop headaches that can be relieved by medicines approved by your health care provider.  You may urinate more often because the fetus is pressing on your bladder.  You may develop or continue to have heartburn as a result of your pregnancy.  You may develop constipation because certain hormones are causing the muscles that push waste through your intestines to slow down.  You may develop hemorrhoids or swollen, bulging veins (varicose veins).  You may have back pain because of the weight gain and pregnancy hormones relaxing your joints between the bones in your pelvis and as a result of a shift in weight and the muscles that support your balance.  Your breasts will continue to grow  and be tender.  Your gums may bleed and may be sensitive to brushing and flossing.  Dark spots or blotches (chloasma, mask of pregnancy) may develop on your face. This will likely fade after the baby is born.  A dark line from your belly button to the pubic area (linea nigra) may appear. This will likely fade after the baby is born.  You may have changes in your hair. These can include thickening of your hair, rapid growth, and changes in texture. Some women also have hair loss during or after pregnancy, or hair that feels dry or thin. Your hair will most likely return to normal after your baby is born. WHAT TO EXPECT AT YOUR PRENATAL VISITS During a routine prenatal visit:  You will  be weighed to make sure you and the fetus are growing normally.  Your blood pressure will be taken.  Your abdomen will be measured to track your baby's growth.  The fetal heartbeat will be listened to.  Any test results from the previous visit will be discussed. Your health care provider may ask you:  How you are feeling.  If you are feeling the baby move.  If you have had any abnormal symptoms, such as leaking fluid, bleeding, severe headaches, or abdominal cramping.  If you have any questions. Other tests that may be performed during your second trimester include:  Blood tests that check for:  Low iron levels (anemia).  Gestational diabetes (between 24 and 28 weeks).  Rh antibodies.  Urine tests to check for infections, diabetes, or protein in the urine.  An ultrasound to confirm the proper growth and development of the baby.  An amniocentesis to check for possible genetic problems.  Fetal screens for spina bifida and Down syndrome. HOME CARE INSTRUCTIONS   Avoid all smoking, herbs, alcohol, and unprescribed drugs. These chemicals affect the formation and growth of the baby.  Follow your health care provider's instructions regarding medicine use. There are medicines that are either  safe or unsafe to take during pregnancy.  Exercise only as directed by your health care provider. Experiencing uterine cramps is a good sign to stop exercising.  Continue to eat regular, healthy meals.  Wear a good support bra for breast tenderness.  Do not use hot tubs, steam rooms, or saunas.  Wear your seat belt at all times when driving.  Avoid raw meat, uncooked cheese, cat litter boxes, and soil used by cats. These carry germs that can cause birth defects in the baby.  Take your prenatal vitamins.  Try taking a stool softener (if your health care provider approves) if you develop constipation. Eat more high-fiber foods, such as fresh vegetables or fruit and whole grains. Drink plenty of fluids to keep your urine clear or pale yellow.  Take warm sitz baths to soothe any pain or discomfort caused by hemorrhoids. Use hemorrhoid cream if your health care provider approves.  If you develop varicose veins, wear support hose. Elevate your feet for 15 minutes, 3-4 times a day. Limit salt in your diet.  Avoid heavy lifting, wear low heel shoes, and practice good posture.  Rest with your legs elevated if you have leg cramps or low back pain.  Visit your dentist if you have not gone yet during your pregnancy. Use a soft toothbrush to brush your teeth and be gentle when you floss.  A sexual relationship may be continued unless your health care provider directs you otherwise.  Continue to go to all your prenatal visits as directed by your health care provider. SEEK MEDICAL CARE IF:   You have dizziness.  You have mild pelvic cramps, pelvic pressure, or nagging pain in the abdominal area.  You have persistent nausea, vomiting, or diarrhea.  You have a bad smelling vaginal discharge.  You have pain with urination. SEEK IMMEDIATE MEDICAL CARE IF:   You have a fever.  You are leaking fluid from your vagina.  You have spotting or bleeding from your vagina.  You have severe  abdominal cramping or pain.  You have rapid weight gain or loss.  You have shortness of breath with chest pain.  You notice sudden or extreme swelling of your face, hands, ankles, feet, or legs.  You have not felt your baby move in  over an hour.  You have severe headaches that do not go away with medicine.  You have vision changes. Document Released: 08/25/2001 Document Revised: 09/05/2013 Document Reviewed: 11/01/2012 [facility] Patient Information 2015 [place], [place]. This information is not intended to replace advice given to you by your health care provider. Make sure you discuss any questions you have with your health care provider.

## 2020-10-14 NOTE — Progress Notes (Addendum)
HIGH-RISK PREGNANCY VISIT Patient name: Suzanne James MRN [id number]  Date of birth: April 03, 1996 Chief Complaint:   Routine Prenatal Visit and High Risk Gestation  History of Present Illness:   Suzanne James is a 25 y.o. G72P2002 female at [redacted]w[redacted]d with an Estimated Date of Delivery: 01/27/21 being seen today for ongoing management of a high-risk pregnancy complicated by chronic hypertension currently on no meds.  Today she reports pressure. Had covid few weeks ago, bp's at home have been better since.  Depression screen [facility] 2/9 07/18/2020 06/14/2020  Decreased Interest 1 0  Down, Depressed, Hopeless 0 0  PHQ - 2 Score 1 0  Altered sleeping 3 0  Tired, decreased energy 2 0  Change in appetite 3 0  Feeling bad or failure about yourself  0 0  Trouble concentrating 2 0  Moving slowly or fidgety/restless 0 0  Suicidal thoughts 0 0  PHQ-9 Score 11 0  Difficult doing work/chores - Not difficult at all    Contractions: Irritability. Vag. Bleeding: None.  Movement: Present. denies leaking of fluid.  Review of Systems:   Pertinent items are noted in HPI Denies abnormal vaginal discharge w/ itching/odor/irritation, headaches, visual changes, shortness of breath, chest pain, abdominal pain, severe nausea/vomiting, or problems with urination or bowel movements unless otherwise stated above. Pertinent History Reviewed:  Reviewed past medical,surgical, social, obstetrical and family history.  Reviewed problem list, medications and allergies. Physical Assessment:   Vitals:   10/14/20 1213  BP: 125/78  Pulse: 81  Weight: 215 lb 12.8 oz (97.9 kg)  Body mass index is 38.23 kg/m.           Physical Examination:   General appearance: alert, well appearing, and in no distress  Mental status: alert, oriented to person, place, and time  Skin: warm & dry   Extremities: Edema: Trace    Cardiovascular: normal heart rate noted  Respiratory: normal respiratory effort, no distress  Abdomen: gravid,  soft, non-tender  Pelvic: Cervical exam deferred         Fetal Status: Fetal Heart Rate (bpm): 160 Fundal Height: 26 cm Movement: Present    Fetal Surveillance Testing today: doppler   Chaperone: N/A    Results for orders placed or performed in visit on 10/14/20 (from the past 24 hour(s))  POC Urinalysis Dipstick OB   Collection Time: 10/14/20  1:30 PM  Result Value Ref Range   Color, UA     Clarity, UA     Glucose, UA Negative Negative   Bilirubin, UA     Ketones, UA neg    Spec Grav, UA     Blood, UA neg    pH, UA     POC,PROTEIN,UA Trace Negative, Trace, Small (1+), Moderate (2+), Large (3+), 4+   Urobilinogen, UA     Nitrite, UA neg    Leukocytes, UA Negative Negative   Appearance     Odor      Assessment & Plan:  High-risk pregnancy: G3P2002 at [redacted]w[redacted]d with an Estimated Date of Delivery: 01/27/21   1) CHTN, dx @ 16wks, stable, no meds  2) H/O GDM  3) H/O FGR> EFW next visit  Meds: No orders of the defined types were placed in this encounter.   Labs/procedures today: none  Treatment Plan:  Growth u/s q4wks     2x/wk testing nst/sono @ 36wks   Deliver @ 39wks:______   Reviewed: Preterm labor symptoms and general obstetric precautions including but not limited to vaginal bleeding, contractions, leaking of  fluid and fetal movement were reviewed in detail with the patient.  All questions were answered. Has home bp cuff. Check bp weekly, let us know if >140/90.   Follow-up: Return in about 3 weeks (around 11/04/2020) for HROB, PN2, in person, MD or CNM, US:EFW.   Future Appointments  Date Time Provider Department Center  11/04/2020  9:00 AM [facility] LAB [facility]  11/04/2020 11:00 AM [facility] [place] [facility] None  11/04/2020 11:30 AM Cresenzo-Dishmon, Scarlette Calico, CNM [facility]    Orders Placed This Encounter  Procedures  . US OB Follow Up  . POC Urinalysis Dipstick OB   Cheral Marker CNM, [facility] 10/14/2020 1:41 PM

## 2020-10-21 ENCOUNTER — Telehealth: Payer: Self-pay | Admitting: Women's Health

## 2020-10-21 ENCOUNTER — Other Ambulatory Visit: Payer: Self-pay | Admitting: Obstetrics & Gynecology

## 2020-10-21 MED ORDER — BUTALBITAL-APAP-CAFFEINE 50-325-40 MG PO TABS: 1.0000 | ORAL_TABLET | Freq: Four times a day (QID) | ORAL | 0 refills | Status: DC | PRN

## 2020-10-21 NOTE — Telephone Encounter (Signed)
Sam w/[employer] left voicemail [phone] Pt had elevated BP 140/83 with no other symptoms Please advise & notify pt

## 2020-10-28 ENCOUNTER — Telehealth: Payer: Self-pay | Admitting: Obstetrics & Gynecology

## 2020-10-28 NOTE — Telephone Encounter (Signed)
Suzanne James called to report elevated BP on SUnday 6:22pm - 141/87 w/no other symptoms

## 2020-11-04 ENCOUNTER — Ambulatory Visit (INDEPENDENT_AMBULATORY_CARE_PROVIDER_SITE_OTHER): Payer: BC Managed Care – PPO

## 2020-11-04 ENCOUNTER — Ambulatory Visit (INDEPENDENT_AMBULATORY_CARE_PROVIDER_SITE_OTHER): Payer: BC Managed Care – PPO | Admitting: Advanced Practice Midwife

## 2020-11-04 ENCOUNTER — Telehealth: Payer: Self-pay

## 2020-11-04 ENCOUNTER — Other Ambulatory Visit: Payer: Self-pay

## 2020-11-04 ENCOUNTER — Other Ambulatory Visit: Payer: BC Managed Care – PPO

## 2020-11-04 VITALS — BP 151/83 | HR 92 | Wt 216.0 lb

## 2020-11-04 DIAGNOSIS — I10 Essential (primary) hypertension: Secondary | ICD-10-CM

## 2020-11-04 DIAGNOSIS — O0993 Supervision of high risk pregnancy, unspecified, third trimester: Secondary | ICD-10-CM

## 2020-11-04 DIAGNOSIS — O10919 Unspecified pre-existing hypertension complicating pregnancy, unspecified trimester: Secondary | ICD-10-CM

## 2020-11-04 DIAGNOSIS — Z23 Encounter for immunization: Secondary | ICD-10-CM

## 2020-11-04 DIAGNOSIS — Z3480 Encounter for supervision of other normal pregnancy, unspecified trimester: Secondary | ICD-10-CM

## 2020-11-04 DIAGNOSIS — Z3A27 27 weeks gestation of pregnancy: Secondary | ICD-10-CM

## 2020-11-04 DIAGNOSIS — Z1389 Encounter for screening for other disorder: Secondary | ICD-10-CM

## 2020-11-04 DIAGNOSIS — O0992 Supervision of high risk pregnancy, unspecified, second trimester: Secondary | ICD-10-CM | POA: Diagnosis not present

## 2020-11-04 DIAGNOSIS — Z3400 Encounter for supervision of normal first pregnancy, unspecified trimester: Secondary | ICD-10-CM

## 2020-11-04 DIAGNOSIS — Z3A28 28 weeks gestation of pregnancy: Secondary | ICD-10-CM

## 2020-11-04 LAB — POCT URINALYSIS DIPSTICK OB
Blood, UA: NEGATIVE
Glucose, UA: NEGATIVE
Ketones, UA: NEGATIVE
Leukocytes, UA: NEGATIVE
Nitrite, UA: NEGATIVE
POC,PROTEIN,UA: NEGATIVE

## 2020-11-04 MED ORDER — LABETALOL HCL 100 MG PO TABS
100.0000 mg | ORAL_TABLET | Freq: Two times a day (BID) | ORAL | 6 refills | Status: DC
Start: 2020-11-04 — End: 2020-12-19

## 2020-11-04 NOTE — Progress Notes (Signed)
HIGH-RISK PREGNANCY VISIT Patient name: Suzanne James MRN [id number]  Date of birth: 10/17/1995 Chief Complaint:   Routine Prenatal Visit, Pregnancy Ultrasound, and High Risk Gestation  History of Present Illness:   Suzanne James is a 25 y.o. G51P2002 female at [redacted]w[redacted]d with an Estimated Date of Delivery: 01/27/21 being seen today for ongoing management of a high-risk pregnancy complicated by [facility] currently on no meds Today she reports no complaints. Contractions: Irritability. Vag. Bleeding: None.  Movement: Present. denies leaking of fluid.  Review of Systems:   Pertinent items are noted in HPI Denies abnormal vaginal discharge w/ itching/odor/irritation, headaches, visual changes, shortness of breath, chest pain, abdominal pain, severe nausea/vomiting, or problems with urination or bowel movements unless otherwise stated above. Pertinent History Reviewed:  Reviewed past medical,surgical, social, obstetrical and family history.  Reviewed problem list, medications and allergies. Physical Assessment:   Vitals:   11/04/20 1014  BP: (!) 151/83  Pulse: 92  Weight: 216 lb (98 kg)  Body mass index is 38.26 kg/m.           Physical Examination:   General appearance: alert, well appearing, and in no distress  Mental status: alert, oriented to person, place, and time  Skin: warm & dry   Extremities: Edema: Trace    Cardiovascular: normal heart rate noted  Respiratory: normal respiratory effort, no distress  Abdomen: gravid, soft, non-tender  Pelvic: Cervical exam deferred         Fetal Status:     Movement: Present    Fetal Surveillance Testing today: [place]   Results for orders placed or performed in visit on 11/04/20 (from the past 24 hour(s))  POC Urinalysis Dipstick OB   Collection Time: 11/04/20 10:17 AM  Result Value Ref Range   Color, UA     Clarity, UA     Glucose, UA Negative Negative   Bilirubin, UA     Ketones, UA neg    Spec Grav, UA     Blood, UA neg    pH, UA      POC,PROTEIN,UA Negative Negative, Trace, Small (1+), Moderate (2+), Large (3+), 4+   Urobilinogen, UA     Nitrite, UA neg    Leukocytes, UA Negative Negative   Appearance     Odor      Assessment & Plan:  1) High-risk pregnancy G3P2002 at [redacted]w[redacted]d with an Estimated Date of Delivery: 01/27/21   2) CHTN, unstable Treatment plan  Start labetalol 100mg  BID, BP check Friday. Twice weekly testing at 32 weeks, iOL 39 weeks    Meds:  Meds ordered this encounter  Medications  . labetalol (NORMODYNE) 100 MG tablet    Sig: Take 1 tablet (100 mg total) by mouth 2 (two) times daily.    Dispense:  60 tablet    Refill:  6    Order Specific Question:   Supervising Provider    Answer:   Thursday [2510]    Labs/procedures today: Lazaro Arms, PN2   Reviewed: Preterm labor symptoms and general obstetric precautions including but not limited to vaginal bleeding, contractions, leaking of fluid and fetal movement were reviewed in detail with the patient.  All questions were answered. Has home bp cuff. Check bp weekly, let us know if >140/90.   Follow-up: Return in about 4 weeks (around 12/02/2020) for Start Monday US/HROB on 3/21 and Thursday NST/RN ; Bp check with nurse (video) Friday .  Future Appointments  Date Time Provider Department Center  11/08/2020 11:10 AM [facility] NURSE  [facility]  12/02/2020 11:00 AM [facility] [place] [facility] None  12/02/2020 11:50 AM Cheral Marker, CNM [facility]  12/05/2020 10:30 AM [facility] NURSE [facility]  12/09/2020  9:00 AM [facility] [place] [facility] None  12/09/2020  9:50 AM Lazaro Arms, MD [facility]  12/12/2020 10:30 AM [facility] NURSE [facility]  12/16/2020  9:00 AM [facility] [place] [facility] None  12/16/2020  9:50 AM Cheral Marker, CNM [facility]  12/19/2020 10:30 AM [facility] NURSE [facility]  12/23/2020  8:30 AM [facility] [place] [facility] None  12/23/2020  9:30 AM Myna Hidalgo, DO [facility]  12/26/2020 10:10 AM  [facility] NURSE [facility]  12/30/2020  8:30 AM [facility] [place] [facility] None  12/30/2020  9:30 AM Cheral Marker, CNM [facility]  01/02/2021  9:50 AM [facility] NURSE [facility]  01/06/2021  8:30 AM [facility] [place] [facility] None  01/06/2021  9:30 AM Lazaro Arms, MD [facility]  01/09/2021 10:30 AM [facility] NURSE [facility]  01/13/2021  8:30 AM [facility] [place] [facility] None  01/13/2021  9:30 AM Lazaro Arms, MD [facility]  01/16/2021  9:50 AM [facility] NURSE [facility]  01/20/2021  8:30 AM [facility] [place] [facility] None  01/20/2021  9:30 AM Lazaro Arms, MD [facility]  01/23/2021  9:30 AM [facility] NURSE [facility]    Orders Placed This Encounter  Procedures  . Tdap vaccine greater than or equal to 7yo IM  . POC Urinalysis Dipstick OB   Jacklyn Shell DNP, CNM 11/04/2020 1:49 PM

## 2020-11-04 NOTE — Progress Notes (Signed)
[place] 28 wks,cephalic,cx 2.7 cm,posterior placenta gr 0,afi 12.5 cm,fhr 138 bpm,normal stomach bubble,left simple choroid plexus cyst 1 x .5x .9cm,EFW 1265 g 63%

## 2020-11-04 NOTE — Patient Instructions (Signed)
Suzanne James, I greatly value your feedback.  If you receive a survey following your visit with [place] today, we appreciate you taking the time to fill it out.  Thanks, Cathie Beams, DNP, CNM  [facility] HAS MOVED!!! It is now [facility] at [facility] ([address] [place], [place] [address]) Entrance located off of [address] Free 24/7 valet parking   Go to [email or web address] to register for FREE online childbirth classes    Call the office [phone]) or go to [facility] if:  You begin to have strong, frequent contractions  Your water breaks.  Sometimes it is a big gush of fluid, sometimes it is just a trickle that keeps getting your panties wet or running down your legs  You have vaginal bleeding.  It is normal to have a small amount of spotting if your cervix was checked.   You don't feel your baby moving like normal.  If you don't, get you something to eat and drink and lay down and focus on feeling your baby move.  You should feel at least 10 movements in 2 hours.  If you don't, you should call the office or go to [facility].   Home Blood Pressure Monitoring for Patients   Your provider has recommended that you check your blood pressure (BP) at least once a week at home. If you do not have a blood pressure cuff at home, one will be provided for you. Contact your provider if you have not received your monitor within 1 week.   Helpful Tips for Accurate Home Blood Pressure Checks  . Don't smoke, exercise, or drink caffeine 30 minutes before checking your BP . Use the restroom before checking your BP (a full bladder can raise your pressure) . Relax in a comfortable upright chair . Feet on the ground . Left arm resting comfortably on a flat surface at the level of your heart . Legs uncrossed . Back supported . Sit quietly and don't talk . Place the cuff on your bare arm . Adjust snuggly, so that only two fingertips can fit  between your skin and the top of the cuff . Check 2 readings separated by at least one minute . Keep a log of your BP readings . For a visual, please reference this diagram: http://ccnc.care/bpdiagram  Provider Name: [facility]     Phone: [phone]  Zone 1: ALL CLEAR  Continue to monitor your symptoms:  . BP reading is less than 140 (top number) or less than 90 (bottom number)  . No right upper stomach pain . No headaches or seeing spots . No feeling nauseated or throwing up . No swelling in face and hands  Zone 2: CAUTION Call your doctor's office for any of the following:  . BP reading is greater than 140 (top number) or greater than 90 (bottom number)  . Stomach pain under your ribs in the middle or right side . Headaches or seeing spots . Feeling nauseated or throwing up . Swelling in face and hands  Zone 3: EMERGENCY  Seek immediate medical care if you have any of the following:  . BP reading is greater than160 (top number) or greater than 110 (bottom number) . Severe headaches not improving with Tylenol . Serious difficulty catching your breath . Any worsening symptoms from Zone 2

## 2020-11-05 ENCOUNTER — Encounter: Payer: Self-pay | Admitting: *Deleted

## 2020-11-05 ENCOUNTER — Other Ambulatory Visit: Payer: Self-pay | Admitting: Women's Health

## 2020-11-05 ENCOUNTER — Encounter: Payer: Self-pay | Admitting: Women's Health

## 2020-11-05 DIAGNOSIS — Z8632 Personal history of gestational diabetes: Secondary | ICD-10-CM | POA: Insufficient documentation

## 2020-11-05 DIAGNOSIS — O24419 Gestational diabetes mellitus in pregnancy, unspecified control: Secondary | ICD-10-CM | POA: Insufficient documentation

## 2020-11-05 LAB — CBC
Hematocrit: 32.6 % — ABNORMAL LOW (ref 34.0–46.6)
Hemoglobin: 10.8 g/dL — ABNORMAL LOW (ref 11.1–15.9)
MCH: 28.6 pg (ref 26.6–33.0)
MCHC: 33.1 g/dL (ref 31.5–35.7)
MCV: 86 fL (ref 79–97)
Platelets: 273 10*3/uL (ref 150–450)
RBC: 3.78 x10E6/uL (ref 3.77–5.28)
RDW: 13.1 % (ref 11.7–15.4)
WBC: 10.9 10*3/uL — ABNORMAL HIGH (ref 3.4–10.8)

## 2020-11-05 LAB — HIV ANTIBODY (ROUTINE TESTING W REFLEX): HIV Screen 4th Generation wRfx: NONREACTIVE

## 2020-11-05 LAB — GLUCOSE TOLERANCE, 2 HOURS W/ 1HR
Glucose, 1 hour: 211 mg/dL — ABNORMAL HIGH (ref 65–179)
Glucose, 2 hour: 180 mg/dL — ABNORMAL HIGH (ref 65–152)
Glucose, Fasting: 102 mg/dL — ABNORMAL HIGH (ref 65–91)

## 2020-11-05 LAB — RPR: RPR Ser Ql: NONREACTIVE

## 2020-11-05 LAB — ANTIBODY SCREEN: Antibody Screen: NEGATIVE

## 2020-11-05 MED ORDER — FERROUS SULFATE 325 (65 FE) MG PO TABS: 325.0000 mg | ORAL_TABLET | ORAL | 2 refills | Status: DC

## 2020-11-06 ENCOUNTER — Other Ambulatory Visit: Payer: Self-pay | Admitting: *Deleted

## 2020-11-06 DIAGNOSIS — O0993 Supervision of high risk pregnancy, unspecified, third trimester: Secondary | ICD-10-CM

## 2020-11-06 DIAGNOSIS — O24410 Gestational diabetes mellitus in pregnancy, diet controlled: Secondary | ICD-10-CM

## 2020-11-06 DIAGNOSIS — Z3A28 28 weeks gestation of pregnancy: Secondary | ICD-10-CM

## 2020-11-07 ENCOUNTER — Other Ambulatory Visit: Payer: Self-pay | Admitting: Women's Health

## 2020-11-07 DIAGNOSIS — O24410 Gestational diabetes mellitus in pregnancy, diet controlled: Secondary | ICD-10-CM

## 2020-11-07 MED ORDER — GLUCOSE BLOOD VI STRP: ORAL_STRIP | 12 refills | Status: DC

## 2020-11-07 MED ORDER — ACCU-CHEK SOFTCLIX LANCETS MISC: 12 refills | Status: DC

## 2020-11-08 ENCOUNTER — Telehealth (INDEPENDENT_AMBULATORY_CARE_PROVIDER_SITE_OTHER): Payer: BC Managed Care – PPO | Admitting: *Deleted

## 2020-11-08 ENCOUNTER — Other Ambulatory Visit: Payer: Self-pay

## 2020-11-08 DIAGNOSIS — Z3483 Encounter for supervision of other normal pregnancy, third trimester: Secondary | ICD-10-CM

## 2020-11-08 DIAGNOSIS — Z3A28 28 weeks gestation of pregnancy: Secondary | ICD-10-CM

## 2020-11-08 DIAGNOSIS — I10 Essential (primary) hypertension: Secondary | ICD-10-CM

## 2020-11-08 NOTE — Progress Notes (Signed)
   I connected with  Suzanne James on 11/08/20 by phone enabled telemedicine application and verified that I am speaking with the correct person using two identifiers.   I discussed the limitations of evaluation and management by telemedicine. The patient expressed understanding and agreed to proceed.  Patient was at her house and I was in the office.   NURSE VISIT- BLOOD PRESSURE CHECK  SUBJECTIVE:  Suzanne James is a 25 y.o. G59P2002 female here for BP check. She is [redacted]w[redacted]d pregnant    HYPERTENSION ROS:  Pregnant/postpartum:  . Severe headaches that don't go away with tylenol/other medicines: No  . Visual changes (seeing spots/double/blurred vision) No  . Severe pain under right breast breast or in center of upper chest No  . Severe nausea/vomiting No  . Taking medicines as instructed yes   OBJECTIVE:  BP 125/67   Pulse 77   LMP 04/14/2020   Appearance alert, well appearing, and in no distress.  ASSESSMENT: Pregnancy [redacted]w[redacted]d  blood pressure check  PLAN: Discussed with Dr. Alysia Penna   Recommendations: no changes needed   Follow-up: as scheduled   Annamarie Dawley  11/08/2020 11:18 AM

## 2020-11-11 ENCOUNTER — Telehealth: Payer: Self-pay | Admitting: Family Medicine

## 2020-11-11 NOTE — Telephone Encounter (Signed)
LVM for pt to call back to sch appt for edu

## 2020-11-29 ENCOUNTER — Other Ambulatory Visit: Payer: Self-pay | Admitting: Advanced Practice Midwife

## 2020-11-29 DIAGNOSIS — O10919 Unspecified pre-existing hypertension complicating pregnancy, unspecified trimester: Secondary | ICD-10-CM

## 2020-12-02 ENCOUNTER — Ambulatory Visit (INDEPENDENT_AMBULATORY_CARE_PROVIDER_SITE_OTHER): Payer: BC Managed Care – PPO

## 2020-12-02 ENCOUNTER — Other Ambulatory Visit: Payer: Self-pay

## 2020-12-02 ENCOUNTER — Other Ambulatory Visit (HOSPITAL_COMMUNITY)
Admission: RE | Admit: 2020-12-02 | Discharge: 2020-12-02 | Disposition: A | Discharge status: Home / Self Care | Payer: BC Managed Care – PPO | Source: Ambulatory Visit | Attending: Obstetrics & Gynecology | Admitting: Obstetrics & Gynecology

## 2020-12-02 ENCOUNTER — Encounter: Payer: Self-pay | Admitting: Women's Health

## 2020-12-02 ENCOUNTER — Ambulatory Visit (INDEPENDENT_AMBULATORY_CARE_PROVIDER_SITE_OTHER): Payer: BC Managed Care – PPO | Admitting: Women's Health

## 2020-12-02 VITALS — BP 128/82 | HR 74 | Wt 215.0 lb

## 2020-12-02 DIAGNOSIS — Z113 Encounter for screening for infections with a predominantly sexual mode of transmission: Secondary | ICD-10-CM | POA: Diagnosis not present

## 2020-12-02 DIAGNOSIS — Z3A32 32 weeks gestation of pregnancy: Secondary | ICD-10-CM | POA: Insufficient documentation

## 2020-12-02 DIAGNOSIS — O0993 Supervision of high risk pregnancy, unspecified, third trimester: Secondary | ICD-10-CM

## 2020-12-02 DIAGNOSIS — O09893 Supervision of other high risk pregnancies, third trimester: Secondary | ICD-10-CM | POA: Diagnosis present

## 2020-12-02 DIAGNOSIS — O10919 Unspecified pre-existing hypertension complicating pregnancy, unspecified trimester: Secondary | ICD-10-CM

## 2020-12-02 DIAGNOSIS — O26853 Spotting complicating pregnancy, third trimester: Secondary | ICD-10-CM

## 2020-12-02 DIAGNOSIS — O24410 Gestational diabetes mellitus in pregnancy, diet controlled: Secondary | ICD-10-CM

## 2020-12-02 DIAGNOSIS — O0992 Supervision of high risk pregnancy, unspecified, second trimester: Secondary | ICD-10-CM

## 2020-12-02 LAB — POCT URINALYSIS DIPSTICK OB
Blood, UA: NEGATIVE
Glucose, UA: NEGATIVE
Ketones, UA: NEGATIVE
Leukocytes, UA: NEGATIVE
Nitrite, UA: NEGATIVE
POC,PROTEIN,UA: NEGATIVE

## 2020-12-02 NOTE — Progress Notes (Signed)
[place] 32 wks,cephalic,BPP 8/8,FHR 146 BPM,RI .70,.65,.70,.69=79%,posterior placenta gr 3,AFI 10.6 cm,resolved CPC,EFW 1968 g 52%

## 2020-12-02 NOTE — Patient Instructions (Signed)
Suzanne James, I greatly value your feedback.  If you receive a survey following your visit with [place] today, we appreciate you taking the time to fill it out.  Thanks, Joellyn Haff, CNM, WHNP-BC  [facility] at [facility] ([address] [place], [place] [address]) Entrance C, located off of [address] valet parking   Go to [email or web address] to register for FREE online childbirth classes    Call the office [phone]) or go to [facility] if:  You begin to have strong, frequent contractions  Your water breaks.  Sometimes it is a big gush of fluid, sometimes it is just a trickle that keeps getting your panties wet or running down your legs  You have vaginal bleeding.  It is normal to have a small amount of spotting if your cervix was checked.   You don't feel your baby moving like normal.  If you don't, get you something to eat and drink and lay down and focus on feeling your baby move.  You should feel at least 10 movements in 2 hours.  If you don't, you should call the office or go to [facility].   Call the office [phone]) or go to [facility] hospital for these signs of pre-eclampsia:  Severe headache that does not go away with Tylenol  Visual changes- seeing spots, double, blurred vision  Pain under your right breast or upper abdomen that does not go away with Tums or heartburn medicine  Nausea and/or vomiting  Severe swelling in your hands, feet, and face    Home Blood Pressure Monitoring for Patients   Your provider has recommended that you check your blood pressure (BP) at least once a week at home. If you do not have a blood pressure cuff at home, one will be provided for you. Contact your provider if you have not received your monitor within 1 week.   Helpful Tips for Accurate Home Blood Pressure Checks  . Don't smoke, exercise, or drink caffeine 30 minutes before checking your BP . Use the restroom before checking your BP (a full  bladder can raise your pressure) . Relax in a comfortable upright chair . Feet on the ground . Left arm resting comfortably on a flat surface at the level of your heart . Legs uncrossed . Back supported . Sit quietly and don't talk . Place the cuff on your bare arm . Adjust snuggly, so that only two fingertips can fit between your skin and the top of the cuff . Check 2 readings separated by at least one minute . Keep a log of your BP readings . For a visual, please reference this diagram: http://ccnc.care/bpdiagram  Provider Name: [facility]     Phone: [phone]  Zone 1: ALL CLEAR  Continue to monitor your symptoms:  . BP reading is less than 140 (top number) or less than 90 (bottom number)  . No right upper stomach pain . No headaches or seeing spots . No feeling nauseated or throwing up . No swelling in face and hands  Zone 2: CAUTION Call your doctor's office for any of the following:  . BP reading is greater than 140 (top number) or greater than 90 (bottom number)  . Stomach pain under your ribs in the middle or right side . Headaches or seeing spots . Feeling nauseated or throwing up . Swelling in face and hands  Zone 3: EMERGENCY  Seek immediate medical care if you have any of the  following:  . BP reading is greater than160 (top number) or greater than 110 (bottom number) . Severe headaches not improving with Tylenol . Serious difficulty catching your breath . Any worsening symptoms from Zone 2  Preterm Labor and Birth Information  The normal length of a pregnancy is 39-41 weeks. Preterm labor is when labor starts before 37 completed weeks of pregnancy. What are the risk factors for preterm labor? Preterm labor is more likely to occur in women who:  Have certain infections during pregnancy such as a bladder infection, sexually transmitted infection, or infection inside the uterus (chorioamnionitis).  Have a shorter-than-normal cervix.  Have gone into  preterm labor before.  Have had surgery on their cervix.  Are younger than age 41 or older than age 6.  Are African American.  Are pregnant with twins or multiple babies (multiple gestation).  Take street drugs or smoke while pregnant.  Do not gain enough weight while pregnant.  Became pregnant shortly after having been pregnant. What are the symptoms of preterm labor? Symptoms of preterm labor include:  Cramps similar to those that can happen during a menstrual period. The cramps may happen with diarrhea.  Pain in the abdomen or lower back.  Regular uterine contractions that may feel like tightening of the abdomen.  A feeling of increased pressure in the pelvis.  Increased watery or bloody mucus discharge from the vagina.  Water breaking (ruptured amniotic sac). Why is it important to recognize signs of preterm labor? It is important to recognize signs of preterm labor because babies who are born prematurely may not be fully developed. This can put them at an increased risk for:  Long-term (chronic) heart and lung problems.  Difficulty immediately after birth with regulating body systems, including blood sugar, body temperature, heart rate, and breathing rate.  Bleeding in the brain.  Cerebral palsy.  Learning difficulties.  Death. These risks are highest for babies who are born before 21 weeks of pregnancy. How is preterm labor treated? Treatment depends on the length of your pregnancy, your condition, and the health of your baby. It may involve: 1. Having a stitch (suture) placed in your cervix to prevent your cervix from opening too early (cerclage). 2. Taking or being given medicines, such as: ? Hormone medicines. These may be given early in pregnancy to help support the pregnancy. ? Medicine to stop contractions. ? Medicines to help mature the baby's lungs. These may be prescribed if the risk of delivery is high. ? Medicines to prevent your baby from  developing cerebral palsy. If the labor happens before 34 weeks of pregnancy, you may need to stay in the hospital. What should I do if I think I am in preterm labor? If you think that you are going into preterm labor, call your health care provider right away. How can I prevent preterm labor in future pregnancies? To increase your chance of having a full-term pregnancy:  Do not use any tobacco products, such as cigarettes, chewing tobacco, and e-cigarettes. If you need help quitting, ask your health care provider.  Do not use street drugs or medicines that have not been prescribed to you during your pregnancy.  Talk with your health care provider before taking any herbal supplements, even if you have been taking them regularly.  Make sure you gain a healthy amount of weight during your pregnancy.  Watch for infection. If you think that you might have an infection, get it checked right away.  Make sure to tell  your health care provider if you have gone into preterm labor before. This information is not intended to replace advice given to you by your health care provider. Make sure you discuss any questions you have with your health care provider. Document Revised: 12/23/2018 Document Reviewed: 01/22/2016 Elsevier Patient Education  [place].

## 2020-12-02 NOTE — Progress Notes (Signed)
HIGH-RISK PREGNANCY VISIT Patient name: Suzanne James MRN [id number]  Date of birth: 1996-03-09 Chief Complaint:   Routine Prenatal Visit ([place])  History of Present Illness:   Suzanne James is a 25 y.o. G55P2002 female at [redacted]w[redacted]d with an Estimated Date of Delivery: 01/27/21 being seen today for ongoing management of a high-risk pregnancy complicated by chronic hypertension currently on Labetalol 100mg  BID and diabetes mellitus A1DM.  Today she reports FBS 5 of 12 >95 (highest 101), only two 2hr pp >120. Still making changes to diet and FBS have been improving. Not eating qhs snack. Bright red spotting yesterday, some mucousy d/c. No itching/odor/irritation.  Depression screen [facility] 2/9 11/04/2020 07/18/2020 06/14/2020  Decreased Interest 1 1 0  Down, Depressed, Hopeless 1 0 0  PHQ - 2 Score 2 1 0  Altered sleeping 3 3 0  Tired, decreased energy 2 2 0  Change in appetite 3 3 0  Feeling bad or failure about yourself  0 0 0  Trouble concentrating 1 2 0  Moving slowly or fidgety/restless 0 0 0  Suicidal thoughts 0 0 0  PHQ-9 Score 11 11 0  Difficult doing work/chores - - Not difficult at all    Contractions: Irritability. Vag. Bleeding: Scant.  Movement: Present. denies leaking of fluid.  Review of Systems:   Pertinent items are noted in HPI Denies abnormal vaginal discharge w/ itching/odor/irritation, headaches, visual changes, shortness of breath, chest pain, abdominal pain, severe nausea/vomiting, or problems with urination or bowel movements unless otherwise stated above. Pertinent History Reviewed:  Reviewed past medical,surgical, social, obstetrical and family history.  Reviewed problem list, medications and allergies. Physical Assessment:   Vitals:   12/02/20 1158  BP: 128/82  Pulse: 74  Weight: 215 lb (97.5 kg)  Body mass index is 38.09 kg/m.           Physical Examination:   General appearance: alert, well appearing, and in no distress  Mental status: alert, oriented to  person, place, and time  Skin: warm & dry   Extremities: Edema: Trace    Cardiovascular: normal heart rate noted  Respiratory: normal respiratory effort, no distress  Abdomen: gravid, soft, non-tender  Pelvic: spec exam cx visually closed, no bleeding, small amt white d/c        Fetal Status: Fetal Heart Rate (bpm): 146 u/s   Movement: Present    Fetal Surveillance Testing today:  12/04/20 32 wks,cephalic,BPP 8/8,FHR 146 BPM,RI .70,.65,.70,.69=79%,posterior placenta gr 3,AFI 10.6 cm,resolved CPC,EFW 1968 g 52%  Chaperone: [place]    Results for orders placed or performed in visit on 12/02/20 (from the past 24 hour(s))  POC Urinalysis Dipstick OB   Collection Time: 12/02/20 12:01 PM  Result Value Ref Range   Color, UA     Clarity, UA     Glucose, UA Negative Negative   Bilirubin, UA     Ketones, UA neg    Spec Grav, UA     Blood, UA neg    pH, UA     POC,PROTEIN,UA Negative Negative, Trace, Small (1+), Moderate (2+), Large (3+), 4+   Urobilinogen, UA     Nitrite, UA neg    Leukocytes, UA Negative Negative   Appearance     Odor      Assessment & Plan:  High-risk pregnancy: G3P2002 at [redacted]w[redacted]d with an Estimated Date of Delivery: 01/27/21   1) CHTN, stable on Labetalol 100mg  BID, ASA162mg   2) A1DM, a little less than 1/2 FBS >95-but still making dietary changes  and they have been improving. To add qhs carb/protein snack. Will re-evaluate next Monday  3) H/O FGR> EFW today 52%   4) Spotting yesterday> CV swab sent, pelvic rest x 7d  Meds: No orders of the defined types were placed in this encounter.   Labs/procedures today: spec exam, CV swab and U/S  Treatment Plan:  Growth u/s @ 36wks    2x/wk testing nst/sono     Deliver 38-39wks (37wks or prn if poor control)____   Reviewed: Preterm labor symptoms and general obstetric precautions including but not limited to vaginal bleeding, contractions, leaking of fluid and fetal movement were reviewed in detail with the patient.   All questions were answered. Has home bp cuff.  Check bp weekly, let us know if >140/90.   Follow-up: Return for As scheduled thurs nst/nurse, mon bpp/dopp & hrob.   Future Appointments  Date Time Provider Department Center  12/05/2020 10:30 AM [facility] NURSE [facility]  12/09/2020  9:00 AM [facility] [place] [facility] None  12/09/2020  9:50 AM Lazaro Arms, MD [facility]  12/12/2020 10:30 AM [facility] NURSE [facility]  12/16/2020  9:00 AM [facility] [place] [facility] None  12/16/2020  9:50 AM Cheral Marker, CNM [facility]  12/19/2020 10:30 AM [facility] NURSE [facility]  12/23/2020  8:30 AM [facility] [place] [facility] None  12/23/2020  9:30 AM Myna Hidalgo, DO [facility]  12/26/2020 10:10 AM [facility] NURSE [facility]  12/30/2020  8:30 AM [facility] [place] [facility] None  12/30/2020  9:30 AM Cheral Marker, CNM [facility]  01/02/2021  9:50 AM [facility] NURSE [facility]  01/06/2021  8:30 AM [facility] [place] [facility] None  01/06/2021  9:30 AM Lazaro Arms, MD [facility]  01/09/2021 10:30 AM [facility] NURSE [facility]  01/13/2021  8:30 AM [facility] [place] [facility] None  01/13/2021  9:30 AM Lazaro Arms, MD [facility]  01/16/2021  9:50 AM [facility] NURSE [facility]  01/20/2021  8:30 AM [facility] [place] [facility] None  01/20/2021  9:30 AM Lazaro Arms, MD [facility]  01/23/2021  9:30 AM [facility] NURSE [facility]    Orders Placed This Encounter  Procedures  . US FETAL BPP WO NON STRESS  . [place] UA Cord Doppler  . POC Urinalysis Dipstick OB   Cheral Marker CNM, [facility] 12/02/2020 12:55 PM

## 2020-12-03 LAB — CERVICOVAGINAL ANCILLARY ONLY
Bacterial Vaginitis (gardnerella): NEGATIVE
Candida Glabrata: NEGATIVE
Candida Vaginitis: NEGATIVE
Chlamydia: NEGATIVE
Comment: NEGATIVE
Comment: NEGATIVE
Comment: NEGATIVE
Comment: NEGATIVE
Comment: NEGATIVE
Comment: NORMAL
Neisseria Gonorrhea: NEGATIVE
Trichomonas: NEGATIVE

## 2020-12-05 ENCOUNTER — Ambulatory Visit (INDEPENDENT_AMBULATORY_CARE_PROVIDER_SITE_OTHER): Payer: BC Managed Care – PPO | Admitting: *Deleted

## 2020-12-05 ENCOUNTER — Other Ambulatory Visit: Payer: Self-pay

## 2020-12-05 VITALS — BP 122/79 | HR 77 | Wt 216.0 lb

## 2020-12-05 DIAGNOSIS — O0993 Supervision of high risk pregnancy, unspecified, third trimester: Secondary | ICD-10-CM | POA: Diagnosis not present

## 2020-12-05 DIAGNOSIS — O10919 Unspecified pre-existing hypertension complicating pregnancy, unspecified trimester: Secondary | ICD-10-CM

## 2020-12-05 LAB — POCT URINALYSIS DIPSTICK OB
Blood, UA: NEGATIVE
Glucose, UA: NEGATIVE
Ketones, UA: NEGATIVE
Leukocytes, UA: NEGATIVE
Nitrite, UA: NEGATIVE

## 2020-12-05 NOTE — Progress Notes (Addendum)
   NURSE VISIT- NST  SUBJECTIVE:  Suzanne James is a 25 y.o. G34P2002 female at [redacted]w[redacted]d, here for a NST for pregnancy complicated by [facility] and A1DM.  She reports active fetal movement, contractions: none, vaginal bleeding: none, membranes: intact.   OBJECTIVE:  BP 122/79   Pulse 77   Wt 216 lb (98 kg)   LMP 04/14/2020   BMI 38.26 kg/m   Appears well, no apparent distress  Results for orders placed or performed in visit on 12/05/20 (from the past 24 hour(s))  POC Urinalysis Dipstick OB   Collection Time: 12/05/20  1:29 PM  Result Value Ref Range   Color, UA     Clarity, UA     Glucose, UA Negative Negative   Bilirubin, UA     Ketones, UA neg    Spec Grav, UA     Blood, UA neg    pH, UA     POC,PROTEIN,UA Trace Negative, Trace, Small (1+), Moderate (2+), Large (3+), 4+   Urobilinogen, UA     Nitrite, UA neg    Leukocytes, UA Negative Negative   Appearance     Odor      NST: FHR baseline 140 bpm, Variability: moderate, Accelerations:present, Decelerations:  Absent= Cat 1/Reactive Toco: none   ASSESSMENT: G3P2002 at [redacted]w[redacted]d with CHTN and A1DM NST reactive  PLAN: EFM strip reviewed by Cathie Beams, CNM   Recommendations: keep next appointment as scheduled    Jobe Marker  12/05/2020 1:29 PM   Chart reviewed for nurse visit. Agree with plan of care.  Jacklyn Shell, [place] 12/05/2020 1:49 PM

## 2020-12-09 ENCOUNTER — Ambulatory Visit (INDEPENDENT_AMBULATORY_CARE_PROVIDER_SITE_OTHER): Payer: BC Managed Care – PPO | Admitting: Obstetrics & Gynecology

## 2020-12-09 ENCOUNTER — Other Ambulatory Visit: Payer: Self-pay

## 2020-12-09 ENCOUNTER — Ambulatory Visit (INDEPENDENT_AMBULATORY_CARE_PROVIDER_SITE_OTHER): Payer: BC Managed Care – PPO

## 2020-12-09 VITALS — BP 132/78 | HR 77 | Wt 218.0 lb

## 2020-12-09 DIAGNOSIS — O24419 Gestational diabetes mellitus in pregnancy, unspecified control: Secondary | ICD-10-CM

## 2020-12-09 DIAGNOSIS — O0993 Supervision of high risk pregnancy, unspecified, third trimester: Secondary | ICD-10-CM

## 2020-12-09 DIAGNOSIS — O24410 Gestational diabetes mellitus in pregnancy, diet controlled: Secondary | ICD-10-CM

## 2020-12-09 DIAGNOSIS — O10919 Unspecified pre-existing hypertension complicating pregnancy, unspecified trimester: Secondary | ICD-10-CM

## 2020-12-09 DIAGNOSIS — O09893 Supervision of other high risk pregnancies, third trimester: Secondary | ICD-10-CM

## 2020-12-09 DIAGNOSIS — Z3A33 33 weeks gestation of pregnancy: Secondary | ICD-10-CM

## 2020-12-09 LAB — POCT URINALYSIS DIPSTICK OB
Blood, UA: NEGATIVE
Glucose, UA: NEGATIVE
Leukocytes, UA: NEGATIVE
Nitrite, UA: NEGATIVE

## 2020-12-09 MED ORDER — GLYBURIDE 5 MG PO TABS: 5.0000 mg | ORAL_TABLET | Freq: Every day | ORAL | 2 refills | Status: DC

## 2020-12-09 NOTE — Progress Notes (Signed)
[place] 33 wks,cephalic,BPP 8/8,posterior placenta gr 3,fhr 146 bpm,AFI 11 cm,RI .69,.56,.64,.56=49%

## 2020-12-09 NOTE — Progress Notes (Signed)
HIGH-RISK PREGNANCY VISIT Patient name: Suzanne James MRN [id number]  Date of birth: 20-Sep-1995 Chief Complaint:   Routine Prenatal Visit  History of Present Illness:   Suzanne James is a 25 y.o. G37P2002 female at [redacted]w[redacted]d with an Estimated Date of Delivery: 01/27/21 being seen today for ongoing management of a high-risk pregnancy complicated by chronic hypertension currently on labetalol 100 mg BID and diabetes mellitus A2DM currently on glyburide 5 mg.  Today she reports no complaints.  Depression screen [facility] 2/9 11/04/2020 07/18/2020 06/14/2020  Decreased Interest 1 1 0  Down, Depressed, Hopeless 1 0 0  PHQ - 2 Score 2 1 0  Altered sleeping 3 3 0  Tired, decreased energy 2 2 0  Change in appetite 3 3 0  Feeling bad or failure about yourself  0 0 0  Trouble concentrating 1 2 0  Moving slowly or fidgety/restless 0 0 0  Suicidal thoughts 0 0 0  PHQ-9 Score 11 11 0  Difficult doing work/chores - - Not difficult at all    Contractions: Irritability. Vag. Bleeding: None.  Movement: Present. denies leaking of fluid.  Review of Systems:   Pertinent items are noted in HPI Denies abnormal vaginal discharge w/ itching/odor/irritation, headaches, visual changes, shortness of breath, chest pain, abdominal pain, severe nausea/vomiting, or problems with urination or bowel movements unless otherwise stated above. Pertinent History Reviewed:  Reviewed past medical,surgical, social, obstetrical and family history.  Reviewed problem list, medications and allergies. Physical Assessment:   Vitals:   12/09/20 0943  BP: 132/78  Pulse: 77  Weight: 218 lb (98.9 kg)  Body mass index is 38.62 kg/m.           Physical Examination:   General appearance: alert, well appearing, and in no distress  Mental status: alert, oriented to person, place, and time  Skin: warm & dry   Extremities: Edema: Trace    Cardiovascular: normal heart rate noted  Respiratory: normal respiratory effort, no  distress  Abdomen: gravid, soft, non-tender  Pelvic: Cervical exam deferred         Fetal Status:     Movement: Present    Fetal Surveillance Testing today: sonogram BPP 8/8, normal Dopplers   Chaperone: N/A    Results for orders placed or performed in visit on 12/09/20 (from the past 24 hour(s))  POC Urinalysis Dipstick OB   Collection Time: 12/09/20  9:47 AM  Result Value Ref Range   Color, UA     Clarity, UA     Glucose, UA Negative Negative   Bilirubin, UA     Ketones, UA trace    Spec Grav, UA     Blood, UA neg    pH, UA     POC,PROTEIN,UA Trace Negative, Trace, Small (1+), Moderate (2+), Large (3+), 4+   Urobilinogen, UA     Nitrite, UA neg    Leukocytes, UA Negative Negative   Appearance     Odor      Assessment & Plan:  High-risk pregnancy: G3P2002 at [redacted]w[redacted]d with an Estimated Date of Delivery: 01/27/21   1) CHTN, stable, on labetalol 100 BID  2) A1 now A2 DM, started PM glyburide to target elevated fastings, stable  Meds:  Meds ordered this encounter  Medications  . glyBURIDE (DIABETA) 5 MG tablet    Sig: Take 1 tablet (5 mg total) by mouth at bedtime.    Dispense:  30 tablet    Refill:  2    Labs/procedures today: U/S  Treatment  Plan:  Continue twice weekly testing as scheduled, IOL 39 weeks  Reviewed: Preterm labor symptoms and general obstetric precautions including but not limited to vaginal bleeding, contractions, leaking of fluid and fetal movement were reviewed in detail with the patient.  All questions were answered. Has home bp cuff. Rx faxed to . Check bp weekly, let us know if >140/90.   Follow-up: No follow-ups on file.   Future Appointments  Date Time Provider Department Center  12/12/2020 10:30 AM [facility] NURSE [facility]  12/16/2020  9:00 AM [facility] [place] [facility] None  12/16/2020  9:50 AM Cheral Marker, CNM [facility]  12/19/2020 10:30 AM [facility] NURSE [facility]  12/23/2020  8:30 AM [facility] [place] [facility] None   12/23/2020  9:30 AM Myna Hidalgo, DO [facility]  12/26/2020 10:10 AM [facility] NURSE [facility]  12/30/2020  8:30 AM [facility] [place] [facility] None  12/30/2020  9:30 AM Cheral Marker, CNM [facility]  01/02/2021  9:50 AM [facility] NURSE [facility]  01/06/2021  8:30 AM [facility] [place] [facility] None  01/06/2021  9:30 AM Lazaro Arms, MD [facility]  01/09/2021 10:30 AM [facility]URSE [facility]  01/13/2021  8:30 AM [facility] [place] [facility] None  01/13/2021  9:30 AM Lazaro Arms, MD [facility]  01/16/2021  9:50 AM [facility] NURSE [facility]  01/20/2021  8:30 AM [facility] [place] [facility] None  01/20/2021  9:30 AM Lazaro Arms, MD [facility]  01/23/2021  9:30 AM [facility] NURSE [facility]    Orders Placed This Encounter  Procedures  . POC Urinalysis Dipstick OB   Lazaro Arms  12/09/2020 10:19 AM

## 2020-12-11 ENCOUNTER — Inpatient Hospital Stay (HOSPITAL_COMMUNITY)
Admission: AD | Admit: 2020-12-11 | Discharge: 2020-12-11 | Disposition: A | Discharge status: Home / Self Care | Payer: BC Managed Care – PPO | Attending: Obstetrics & Gynecology | Admitting: Obstetrics & Gynecology

## 2020-12-11 ENCOUNTER — Inpatient Hospital Stay (HOSPITAL_BASED_OUTPATIENT_CLINIC_OR_DEPARTMENT_OTHER): Payer: BC Managed Care – PPO

## 2020-12-11 ENCOUNTER — Other Ambulatory Visit: Payer: Self-pay

## 2020-12-11 ENCOUNTER — Encounter (HOSPITAL_COMMUNITY): Payer: Self-pay | Admitting: Obstetrics & Gynecology

## 2020-12-11 ENCOUNTER — Other Ambulatory Visit: Payer: Self-pay | Admitting: Obstetrics & Gynecology

## 2020-12-11 DIAGNOSIS — O10013 Pre-existing essential hypertension complicating pregnancy, third trimester: Secondary | ICD-10-CM

## 2020-12-11 DIAGNOSIS — O24415 Gestational diabetes mellitus in pregnancy, controlled by oral hypoglycemic drugs: Secondary | ICD-10-CM | POA: Insufficient documentation

## 2020-12-11 DIAGNOSIS — E119 Type 2 diabetes mellitus without complications: Secondary | ICD-10-CM

## 2020-12-11 DIAGNOSIS — O24113 Pre-existing diabetes mellitus, type 2, in pregnancy, third trimester: Secondary | ICD-10-CM | POA: Diagnosis not present

## 2020-12-11 DIAGNOSIS — Z833 Family history of diabetes mellitus: Secondary | ICD-10-CM | POA: Insufficient documentation

## 2020-12-11 DIAGNOSIS — Z3A33 33 weeks gestation of pregnancy: Secondary | ICD-10-CM | POA: Insufficient documentation

## 2020-12-11 DIAGNOSIS — Z7982 Long term (current) use of aspirin: Secondary | ICD-10-CM | POA: Diagnosis not present

## 2020-12-11 DIAGNOSIS — O4693 Antepartum hemorrhage, unspecified, third trimester: Secondary | ICD-10-CM | POA: Insufficient documentation

## 2020-12-11 DIAGNOSIS — Z79899 Other long term (current) drug therapy: Secondary | ICD-10-CM | POA: Insufficient documentation

## 2020-12-11 LAB — URINALYSIS, ROUTINE W REFLEX MICROSCOPIC
Bilirubin Urine: NEGATIVE
Glucose, UA: NEGATIVE mg/dL
Ketones, ur: NEGATIVE mg/dL
Leukocytes,Ua: NEGATIVE
Nitrite: NEGATIVE
Protein, ur: NEGATIVE mg/dL
Specific Gravity, Urine: 1.015 (ref 1.005–1.030)
pH: 7 (ref 5.0–8.0)

## 2020-12-11 NOTE — Discharge Instructions (Signed)
Fetal Movement Counts Patient Name: ________________________________________________ Patient Due Date: ____________________  What is a fetal movement count? A fetal movement count is the number of times that you feel your baby move during a certain amount of time. This may also be called a fetal kick count. A fetal movement count is recommended for every pregnant woman. You may be asked to start counting fetal movements as early as week 28 of your pregnancy. Pay attention to when your baby is most active. You may notice your baby's sleep and wake cycles. You may also notice things that make your baby move more. You should do a fetal movement count:  When your baby is normally most active.  At the same time each day. A good time to count movements is while you are resting, after having something to eat and drink. How do I count fetal movements? 1. Find a quiet, comfortable area. Sit, or lie down on your side. 2. Write down the date, the start time and stop time, and the number of movements that you felt between those two times. Take this information with you to your health care visits. 3. Write down your start time when you feel the first movement. 4. Count kicks, flutters, swishes, rolls, and jabs. You should feel at least 10 movements. 5. You may stop counting after you have felt 10 movements, or if you have been counting for 2 hours. Write down the stop time. 6. If you do not feel 10 movements in 2 hours, contact your health care provider for further instructions. Your health care provider may want to do additional tests to assess your baby's well-being. Contact a health care provider if:  You feel fewer than 10 movements in 2 hours.  Your baby is not moving like he or she usually does. Date: ____________ Start time: ____________ Stop time: ____________ Movements: ____________ Date: ____________ Start time: ____________ Stop time: ____________ Movements: ____________ Date: ____________  Start time: ____________ Stop time: ____________ Movements: ____________ Date: ____________ Start time: ____________ Stop time: ____________ Movements: ____________ Date: ____________ Start time: ____________ Stop time: ____________ Movements: ____________ Date: ____________ Start time: ____________ Stop time: ____________ Movements: ____________ Date: ____________ Start time: ____________ Stop time: ____________ Movements: ____________ Date: ____________ Start time: ____________ Stop time: ____________ Movements: ____________ Date: ____________ Start time: ____________ Stop time: ____________ Movements: ____________ This information is not intended to replace advice given to you by your health care provider. Make sure you discuss any questions you have with your health care provider. Document Revised: 04/20/2019 Document Reviewed: 04/20/2019 Elsevier Patient Education  2021 [employer] Vaginal Bleeding During Pregnancy, Third Trimester A small amount of bleeding from the vagina, or spotting, is common during pregnancy. Sometimes bleeding is normal and is not a problem. However, bleeding during the third trimester can also be a sign of something serious for the mother and the baby. Some normal things may cause bleeding or spotting during the third trimester. They include:  Rapid changes in blood vessels. This is caused by changes that are happening to the body during pregnancy.  Sex.  Pelvic exams. Some abnormal causes of vaginal bleeding during the third trimester include:  Infection in the cervix.  Growths on the cervix. The growths on the cervix are also called polyps.  A condition in which the placenta partially or completely covers the opening of the cervix inside the uterus (placenta previa).  The placenta separating from the uterus (placenta abruption).  Early labor, also called preterm labor.  A condition in  which the placenta grows into the muscle of the uterus (placenta  accreta). Tell your health care provider about any vaginal bleeding right away. Follow these instructions at home: Monitoring your bleeding  Pay attention to any changes in your symptoms. Let your health care provider know about any concerns.  Try to understand when the bleeding occurs. Does the bleeding start on its own, or does it start after something is done, such as sex or a pelvic exam?  Use a diary to record the things you see about your bleeding, including: ? The kind of bleeding you are having. Does the bleeding start and stop irregularly, or is it a constant flow? ? The severity of the bleeding. Is the bleeding heavy or light? ? The number of pads you use each day, how often you change them, and how soaked they are.  Tell your health care provider if you pass tissue. He or she may want to see it.   Activity  Follow instructions from your health care provider about limiting your activity. If your health care provider recommends activity restriction, you may need to stay in bed and only get up to use the bathroom. In some cases, your health care provider may allow you to continue light activity.  Ask your health care provider if it is safe for you to drive.  Do not lift anything that is heavier than 10 lb (4.5 kg), or the limit that you are told, until your health care provider says that it is safe.  Do not have sex until your health care provider says that this is safe.  If needed, make plans for someone to help with your regular activities. Medicines  Take over-the-counter and prescription medicines only as told by your health care provider.  Do not take aspirin because it can cause bleeding. General instructions  Do not use tampons or douche.  Keep all follow-up visits. This is important. Contact a health care provider if:  You have vaginal bleeding during any part of your pregnancy.  You have cramps or labor pains.  You have a fever. Get help right away  if:  You have severe cramps or pain in your back or abdomen.  You have a gush of fluid from the vagina.  Your bleeding increases or you pass large clots or a large amount of tissue from your vagina.  You feel light-headed or weak, or you faint.  You feel that your baby is moving less than usual, or not moving at all. Summary  Some normal things can cause bleeding or spotting in pregnancy.  Bleeding during the third trimester can be a sign of a serious problem for the mother and the baby.  Be sure to tell your health care provider about any vaginal bleeding right away. This information is not intended to replace advice given to you by your health care provider. Make sure you discuss any questions you have with your health care provider. Document Revised: 05/23/2020 Document Reviewed: 05/23/2020 Elsevier Patient Education  2021 [employer].

## 2020-12-11 NOTE — MAU Note (Signed)
Presents with c/o spotting and cramping.  States spotting began yesterday, was bright red, now dark brown.  Also has lower abdominal and back cramping.  Denies recent intercourse, states on pelvic rest.  Reports been on pelvic rest since last Monday secondary spotting.  Denies LOF.  Endorses +FM.

## 2020-12-11 NOTE — MAU Provider Note (Signed)
History     [id number]  Arrival date and time: 12/11/20 1230    Chief Complaint  Patient presents with  . Spotting  . Cramping     HPI Suzanne James is a 25 y.o. at [redacted]w[redacted]d by 10 wk ultrasound with PMHx notable for cHTN and A2GDM, who presents for spotting and cramping. She was seen in the clinic last Monday due to spotting and was placed on pelvic rest. The spotting stopped then resumed yesterday. She denies erythema, itching, and irritation. She has not passed clots. Her first pregnancy was complicated by IUGR secondary to cHTN and GDM and her second pregnancy was complicated by subchorionic hematoma.   Vaginal bleeding: Yes LOF: No Fetal Movement: Yes Contractions: No  O/Positive/-- (11/04 1555)  OB History    Gravida  3   Para  2   Term  2   Preterm      AB      Living  2     SAB      IAB      Ectopic      Multiple      Live Births  2           Past Medical History:  Diagnosis Date  . Strep throat     Past Surgical History:  Procedure Laterality Date  . TONSILLECTOMY      Family History  Problem Relation Age of Onset  . Asthma Sister   . Hypertension Sister   . Heart attack Maternal Grandmother   . Hypertension Paternal Grandmother   . Diabetes Paternal Grandmother     Social History   Socioeconomic History  . Marital status: Married    Spouse name: Not on file  . Number of children: Not on file  . Years of education: Not on file  . Highest education level: Not on file  Occupational History  . Not on file  Tobacco Use  . Smoking status: Never Smoker  . Smokeless tobacco: Never Used  Vaping Use  . Vaping Use: Never used  Substance and Sexual Activity  . Alcohol use: No  . Drug use: No  . Sexual activity: Yes    Birth control/protection: None  Other Topics Concern  . Not on file  Social History Narrative  . Not on file   Social Determinants of Health   Financial Resource Strain: Low Risk   . Difficulty of Paying  Living Expenses: Not hard at all  Food Insecurity: No Food Insecurity  . Worried About Programme researcher, broadcasting/film/video in the Last Year: Never true  . Ran Out of Food in the Last Year: Never true  Transportation Needs: No Transportation Needs  . Lack of Transportation (Medical): No  . Lack of Transportation (Non-Medical): No  Physical Activity: Insufficiently Active  . Days of Exercise per Week: 4 days  . Minutes of Exercise per Session: 30 min  Stress: Stress Concern Present  . Feeling of Stress : To some extent  Social Connections: Moderately Integrated  . Frequency of Communication with Friends and Family: More than three times a week  . Frequency of Social Gatherings with Friends and Family: Twice a week  . Attends Religious Services: 1 to 4 times per year  . Active Member of Clubs or Organizations: No  . Attends Banker Meetings: Never  . Marital Status: Married  Catering manager Violence: Not At Risk  . Fear of Current or Ex-Partner: No  . Emotionally Abused: No  . Physically Abused:  No  . Sexually Abused: No    No Known Allergies  No current facility-administered medications on file prior to encounter.   Current Outpatient Medications on File Prior to Encounter  Medication Sig Dispense Refill  . aspirin EC 81 MG tablet Take 2 tablets (162 mg total) by mouth daily. 60 tablet 6  . butalbital-acetaminophen-caffeine (FIORICET) 50-325-40 MG tablet Take 1-2 tablets by mouth every 6 (six) hours as needed for headache. (Patient not taking: No sig reported) 20 tablet 0  . ferrous sulfate 325 (65 FE) MG tablet Take 1 tablet (325 mg total) by mouth every other day. 45 tablet 2  . labetalol (NORMODYNE) 100 MG tablet Take 1 tablet (100 mg total) by mouth 2 (two) times daily. 60 tablet 6  . Prenatal Vit-Fe Fumarate-FA (MULTIVITAMIN-PRENATAL) 27-0.8 MG TABS tablet Take 1 tablet by mouth daily at 12 noon.    . sertraline (ZOLOFT) 25 MG tablet TAKE 1 TABLET BY MOUTH EVERY DAY 90 tablet 3   . Accu-Chek Softclix Lancets lancets Use as instructed to check blood sugar 4 times daily 100 each 12  . glucose blood (ACCU-CHEK GUIDE) test strip QS for QID testing 200 strip 12  . glyBURIDE (DIABETA) 5 MG tablet TAKE 1 TABLET BY MOUTH EVERYDAY AT BEDTIME 30 tablet 2  . omeprazole (PRILOSEC) 20 MG capsule Take 20 mg by mouth daily. (Patient not taking: No sig reported)    . ondansetron (ZOFRAN ODT) 4 MG disintegrating tablet Take 1 tablet (4 mg total) by mouth every 6 (six) hours as needed for nausea. (Patient not taking: No sig reported) 30 tablet 2  . [DISCONTINUED] Norgestimate-Ethinyl Estradiol Triphasic 0.18/0.215/0.25 MG-35 MCG tablet Take 1 tablet by mouth daily.       Review of Systems  Constitutional: Negative for fever.  Respiratory: Negative for shortness of breath.   Cardiovascular: Negative for chest pain.  Gastrointestinal: Negative for abdominal pain, constipation, diarrhea, nausea and vomiting.   Pertinent positives and negative per HPI, all others reviewed and negative  Physical Exam   BP 130/80 (BP Location: Right Arm)   Pulse 82   Temp 98.3 F (36.8 C) (Oral)   Resp 20   Ht 5\' 3"  (1.6 m)   Wt 97.5 kg   LMP 04/14/2020   SpO2 100%   BMI 38.09 kg/m   Physical Exam Constitutional:      Appearance: Normal appearance.  Cardiovascular:     Rate and Rhythm: Normal rate.  Pulmonary:     Effort: Pulmonary effort is normal.  Abdominal:     General: Abdomen is flat.  Skin:    General: Skin is warm and dry.  Neurological:     General: No focal deficit present.     Mental Status: She is alert.  Psychiatric:        Mood and Affect: Mood normal.     Cervical Exam Dilation: Closed Effacement (%): Thick Cervical Position: Posterior Station: -3 Exam by:: [email or web address] NP   FHT Baseline 140 bpm, moderate variability, + accels, no decels Toco: None Cat: 1  Labs Results for orders placed or performed during the hospital encounter of 12/11/20 (from the  past 24 hour(s))  Urinalysis, Routine w reflex microscopic Urine, Clean Catch     Status: Abnormal   Collection Time: 12/11/20  1:42 PM  Result Value Ref Range   Color, Urine YELLOW YELLOW   APPearance HAZY (A) CLEAR   Specific Gravity, Urine 1.015 1.005 - 1.030   pH 7.0 5.0 - 8.0  Glucose, UA NEGATIVE NEGATIVE mg/dL   Hgb urine dipstick MODERATE (A) NEGATIVE   Bilirubin Urine NEGATIVE NEGATIVE   Ketones, ur NEGATIVE NEGATIVE mg/dL   Protein, ur NEGATIVE NEGATIVE mg/dL   Nitrite NEGATIVE NEGATIVE   Leukocytes,Ua NEGATIVE NEGATIVE   RBC / HPF 6-10 0 - 5 RBC/hpf   WBC, UA 0-5 0 - 5 WBC/hpf   Bacteria, UA RARE (A) NONE SEEN   Squamous Epithelial / LPF 6-10 0 - 5   Mucus PRESENT     Imaging No results found.  MAU Course  Procedures  Lab Orders     Urinalysis, Routine w reflex microscopic Urine, Clean Catch No orders of the defined types were placed in this encounter.   Imaging Orders     [place] MFM OB LIMITED  MDM none  Assessment and Plan  Suzanne James is a 25 y.o. at [redacted]w[redacted]d by 10 wk ultrasound who presents for spotting and cramping.  #Spotting: Order [place] MFM OB #CHTN: Continue taking labetalol 100 mg BID. #A2GDM: Continue taking glyburide 5 mg qd. #FWB: FHT Cat 1, NST reactive  Alferd Patee

## 2020-12-12 ENCOUNTER — Ambulatory Visit (INDEPENDENT_AMBULATORY_CARE_PROVIDER_SITE_OTHER): Payer: BC Managed Care – PPO | Admitting: *Deleted

## 2020-12-12 VITALS — BP 118/76 | Wt 215.4 lb

## 2020-12-12 DIAGNOSIS — O24410 Gestational diabetes mellitus in pregnancy, diet controlled: Secondary | ICD-10-CM

## 2020-12-12 DIAGNOSIS — Z1389 Encounter for screening for other disorder: Secondary | ICD-10-CM | POA: Diagnosis not present

## 2020-12-12 DIAGNOSIS — Z331 Pregnant state, incidental: Secondary | ICD-10-CM

## 2020-12-12 DIAGNOSIS — O0993 Supervision of high risk pregnancy, unspecified, third trimester: Secondary | ICD-10-CM

## 2020-12-12 DIAGNOSIS — O10919 Unspecified pre-existing hypertension complicating pregnancy, unspecified trimester: Secondary | ICD-10-CM | POA: Diagnosis not present

## 2020-12-12 DIAGNOSIS — O288 Other abnormal findings on antenatal screening of mother: Secondary | ICD-10-CM

## 2020-12-12 LAB — CULTURE, OB URINE
Culture: <10000 — AB
Special Requests: NORMAL

## 2020-12-12 LAB — POCT URINALYSIS DIPSTICK OB
Blood, UA: NEGATIVE
Glucose, UA: NEGATIVE
Ketones, UA: NEGATIVE
Leukocytes, UA: NEGATIVE
Nitrite, UA: NEGATIVE

## 2020-12-12 NOTE — Progress Notes (Addendum)
   NURSE VISIT- NST  SUBJECTIVE:  Suzanne James is a 25 y.o. G68P2002 female at [redacted]w[redacted]d, here for a NST for pregnancy complicated by [facility] and A2DM currently on Glyburide.  She reports active fetal movement, contractions: none, vaginal bleeding: none, membranes: intact.   OBJECTIVE:  BP 118/76   Wt 215 lb 6.4 oz (97.7 kg)   LMP 04/14/2020   BMI 38.16 kg/m   Appears well, no apparent distress  Results for orders placed or performed in visit on 12/12/20 (from the past 24 hour(s))  POC Urinalysis Dipstick OB   Collection Time: 12/12/20 10:58 AM  Result Value Ref Range   Color, UA     Clarity, UA     Glucose, UA Negative Negative   Bilirubin, UA     Ketones, UA neg    Spec Grav, UA     Blood, UA neg    pH, UA     POC,PROTEIN,UA Trace Negative, Trace, Small (1+), Moderate (2+), Large (3+), 4+   Urobilinogen, UA     Nitrite, UA neg    Leukocytes, UA Negative Negative   Appearance     Odor    Results for orders placed or performed during the hospital encounter of 12/11/20 (from the past 24 hour(s))  Urinalysis, Routine w reflex microscopic Urine, Clean Catch   Collection Time: 12/11/20  1:42 PM  Result Value Ref Range   Color, Urine YELLOW YELLOW   APPearance HAZY (A) CLEAR   Specific Gravity, Urine 1.015 1.005 - 1.030   pH 7.0 5.0 - 8.0   Glucose, UA NEGATIVE NEGATIVE mg/dL   Hgb urine dipstick MODERATE (A) NEGATIVE   Bilirubin Urine NEGATIVE NEGATIVE   Ketones, ur NEGATIVE NEGATIVE mg/dL   Protein, ur NEGATIVE NEGATIVE mg/dL   Nitrite NEGATIVE NEGATIVE   Leukocytes,Ua NEGATIVE NEGATIVE   RBC / HPF 6-10 0 - 5 RBC/hpf   WBC, UA 0-5 0 - 5 WBC/hpf   Bacteria, UA RARE (A) NONE SEEN   Squamous Epithelial / LPF 6-10 0 - 5   Mucus PRESENT   Culture, OB Urine   Collection Time: 12/11/20  1:43 PM   Specimen: OB Clean Catch; Urine  Result Value Ref Range   Specimen Description OB CLEAN CATCH    Special Requests Normal    Culture (A)     <10,000 COLONIES/mL INSIGNIFICANT  GROWTH NO GROUP B STREP (S.AGALACTIAE) ISOLATED Performed at [facility], [address] [place], [place] [address]    Report Status 12/12/2020 FINAL     NST: FHR baseline 140 bpm, Variability: moderate, Accelerations:present, Decelerations:  Absent= Cat 1/Reactive Toco: none   ASSESSMENT: G3P2002 at [redacted]w[redacted]d with CHTN and A2DM  NST reactive  PLAN: EFM strip reviewed by Dr. Despina Hidden   Recommendations: keep next appointment as scheduled    Jobe Marker  12/12/2020 12:35 PM   Attestation of Attending Supervision of Nursing Visit Encounter: Evaluation and management procedures were performed by the nursing staff under my supervision and collaboration.  I have reviewed the nurse's note and chart, and I agree with the management and plan.  Rockne Coons MD Attending Physician for the [facility] 12/12/2020 2:23 PM

## 2020-12-13 ENCOUNTER — Inpatient Hospital Stay (HOSPITAL_COMMUNITY)
Admission: AD | Admit: 2020-12-13 | Discharge: 2020-12-19 | DRG: 806 | Disposition: A | Discharge status: Home / Self Care | Payer: BC Managed Care – PPO | Attending: Obstetrics and Gynecology | Admitting: Obstetrics and Gynecology

## 2020-12-13 ENCOUNTER — Other Ambulatory Visit: Payer: Self-pay

## 2020-12-13 DIAGNOSIS — O24425 Gestational diabetes mellitus in childbirth, controlled by oral hypoglycemic drugs: Secondary | ICD-10-CM | POA: Diagnosis present

## 2020-12-13 DIAGNOSIS — O0993 Supervision of high risk pregnancy, unspecified, third trimester: Secondary | ICD-10-CM

## 2020-12-13 DIAGNOSIS — O0990 Supervision of high risk pregnancy, unspecified, unspecified trimester: Secondary | ICD-10-CM

## 2020-12-13 DIAGNOSIS — Z8759 Personal history of other complications of pregnancy, childbirth and the puerperium: Secondary | ICD-10-CM

## 2020-12-13 DIAGNOSIS — O24419 Gestational diabetes mellitus in pregnancy, unspecified control: Secondary | ICD-10-CM | POA: Diagnosis present

## 2020-12-13 DIAGNOSIS — Z79899 Other long term (current) drug therapy: Secondary | ICD-10-CM

## 2020-12-13 DIAGNOSIS — Z3A33 33 weeks gestation of pregnancy: Secondary | ICD-10-CM

## 2020-12-13 DIAGNOSIS — O42913 Preterm premature rupture of membranes, unspecified as to length of time between rupture and onset of labor, third trimester: Secondary | ICD-10-CM | POA: Diagnosis not present

## 2020-12-13 DIAGNOSIS — Z8632 Personal history of gestational diabetes: Secondary | ICD-10-CM | POA: Diagnosis present

## 2020-12-13 DIAGNOSIS — O99284 Endocrine, nutritional and metabolic diseases complicating childbirth: Secondary | ICD-10-CM | POA: Diagnosis present

## 2020-12-13 DIAGNOSIS — Z8751 Personal history of pre-term labor: Secondary | ICD-10-CM

## 2020-12-13 DIAGNOSIS — O4693 Antepartum hemorrhage, unspecified, third trimester: Secondary | ICD-10-CM | POA: Diagnosis present

## 2020-12-13 DIAGNOSIS — O99344 Other mental disorders complicating childbirth: Secondary | ICD-10-CM | POA: Diagnosis present

## 2020-12-13 DIAGNOSIS — F32A Depression, unspecified: Secondary | ICD-10-CM | POA: Diagnosis present

## 2020-12-13 DIAGNOSIS — E876 Hypokalemia: Secondary | ICD-10-CM | POA: Diagnosis present

## 2020-12-13 DIAGNOSIS — O24415 Gestational diabetes mellitus in pregnancy, controlled by oral hypoglycemic drugs: Secondary | ICD-10-CM

## 2020-12-13 DIAGNOSIS — O09299 Supervision of pregnancy with other poor reproductive or obstetric history, unspecified trimester: Secondary | ICD-10-CM

## 2020-12-13 DIAGNOSIS — O1002 Pre-existing essential hypertension complicating childbirth: Secondary | ICD-10-CM | POA: Diagnosis present

## 2020-12-13 DIAGNOSIS — O10919 Unspecified pre-existing hypertension complicating pregnancy, unspecified trimester: Secondary | ICD-10-CM | POA: Diagnosis present

## 2020-12-13 DIAGNOSIS — I10 Essential (primary) hypertension: Secondary | ICD-10-CM | POA: Diagnosis present

## 2020-12-13 DIAGNOSIS — F419 Anxiety disorder, unspecified: Secondary | ICD-10-CM | POA: Diagnosis present

## 2020-12-13 DIAGNOSIS — O43123 Velamentous insertion of umbilical cord, third trimester: Secondary | ICD-10-CM | POA: Diagnosis present

## 2020-12-13 MED ORDER — LABETALOL HCL 100 MG PO TABS
100.0000 mg | ORAL_TABLET | Freq: Once | ORAL | Status: AC
  Administered 2020-12-13: 100 mg via ORAL
  Filled 2020-12-13: qty 1

## 2020-12-13 NOTE — MAU Note (Signed)
Pt did not take nighttime BP med

## 2020-12-13 NOTE — MAU Note (Signed)
Pt states tonight around 2030 she noticed vaginal bleeding mixed with mucous, also some blood in the toilet. She had been evaluated for spotting yesterday. States she is on pelvic rest. Also thinks shes having contractions but is not sure. She denies LOF, +FM

## 2020-12-14 ENCOUNTER — Encounter (HOSPITAL_COMMUNITY): Payer: Self-pay | Admitting: Obstetrics & Gynecology

## 2020-12-14 DIAGNOSIS — O4693 Antepartum hemorrhage, unspecified, third trimester: Secondary | ICD-10-CM | POA: Diagnosis present

## 2020-12-14 LAB — URINALYSIS, ROUTINE W REFLEX MICROSCOPIC
Bilirubin Urine: NEGATIVE
Glucose, UA: NEGATIVE mg/dL
Ketones, ur: NEGATIVE mg/dL
Leukocytes,Ua: NEGATIVE
Nitrite: NEGATIVE
Protein, ur: NEGATIVE mg/dL
Specific Gravity, Urine: 1.013 (ref 1.005–1.030)
pH: 7 (ref 5.0–8.0)

## 2020-12-14 LAB — CBC
HCT: 32.6 % — ABNORMAL LOW (ref 36.0–46.0)
Hemoglobin: 10.7 g/dL — ABNORMAL LOW (ref 12.0–15.0)
MCH: 27.8 pg (ref 26.0–34.0)
MCHC: 32.8 g/dL (ref 30.0–36.0)
MCV: 84.7 fL (ref 80.0–100.0)
Platelets: 262 10*3/uL (ref 150–400)
RBC: 3.85 MIL/uL — ABNORMAL LOW (ref 3.87–5.11)
RDW: 13.5 % (ref 11.5–15.5)
WBC: 14.1 10*3/uL — ABNORMAL HIGH (ref 4.0–10.5)
nRBC: 0 % (ref 0.0–0.2)

## 2020-12-14 LAB — GLUCOSE, CAPILLARY
Glucose-Capillary: 72 mg/dL (ref 70–99)
Glucose-Capillary: 108 mg/dL — ABNORMAL HIGH (ref 70–99)
Glucose-Capillary: 161 mg/dL — ABNORMAL HIGH (ref 70–99)
Glucose-Capillary: 110 mg/dL — ABNORMAL HIGH (ref 70–99)

## 2020-12-14 LAB — AMNISURE RUPTURE OF MEMBRANE (ROM) NOT AT ARMC: Amnisure ROM: POSITIVE

## 2020-12-14 MED ORDER — DOCUSATE SODIUM 100 MG PO CAPS: 100.0000 mg | ORAL_CAPSULE | Freq: Two times a day (BID) | ORAL | Status: DC | PRN

## 2020-12-14 MED ORDER — CALCIUM CARBONATE ANTACID 500 MG PO CHEW
2.0000 | CHEWABLE_TABLET | ORAL | Status: DC | PRN
  Filled 2020-12-14: qty 2

## 2020-12-14 MED ORDER — BETAMETHASONE SOD PHOS & ACET 6 (3-3) MG/ML IJ SUSP
12.0000 mg | INTRAMUSCULAR | Status: AC
  Administered 2020-12-14 – 2020-12-15 (×2): 12 mg via INTRAMUSCULAR
  Filled 2020-12-14: qty 5

## 2020-12-14 MED ORDER — LACTATED RINGERS IV BOLUS
1000.0000 mL | Freq: Once | INTRAVENOUS | Status: AC
  Administered 2020-12-14: 1000 mL via INTRAVENOUS

## 2020-12-14 MED ORDER — SODIUM CHLORIDE 0.9% FLUSH: 3.0000 mL | INTRAVENOUS | Status: DC | PRN

## 2020-12-14 MED ORDER — ACETAMINOPHEN 325 MG PO TABS
650.0000 mg | ORAL_TABLET | ORAL | Status: DC | PRN
  Administered 2020-12-14 – 2020-12-15 (×4): 650 mg via ORAL
  Filled 2020-12-14 (×4): qty 2

## 2020-12-14 MED ORDER — SODIUM CHLORIDE 0.9 % IV SOLN
250.0000 mL | INTRAVENOUS | Status: DC | PRN
  Administered 2020-12-14: 250 mL via INTRAVENOUS

## 2020-12-14 MED ORDER — PRENATAL MULTIVITAMIN CH
1.0000 | ORAL_TABLET | Freq: Every day | ORAL | Status: DC
  Administered 2020-12-14 – 2020-12-15 (×2): 1 via ORAL
  Filled 2020-12-14 (×2): qty 1

## 2020-12-14 MED ORDER — AZITHROMYCIN 250 MG PO TABS
1000.0000 mg | ORAL_TABLET | Freq: Once | ORAL | Status: AC
  Administered 2020-12-14: 1000 mg via ORAL
  Filled 2020-12-14: qty 4

## 2020-12-14 MED ORDER — SODIUM CHLORIDE 0.9% FLUSH
3.0000 mL | Freq: Two times a day (BID) | INTRAVENOUS | Status: DC
  Administered 2020-12-14: 3 mL via INTRAVENOUS

## 2020-12-14 MED ORDER — GLYBURIDE 5 MG PO TABS
5.0000 mg | ORAL_TABLET | Freq: Once | ORAL | Status: AC
  Administered 2020-12-14: 5 mg via ORAL
  Filled 2020-12-14: qty 1

## 2020-12-14 MED ORDER — NIFEDIPINE ER OSMOTIC RELEASE 30 MG PO TB24
30.0000 mg | ORAL_TABLET | Freq: Every day | ORAL | Status: DC
  Administered 2020-12-14 – 2020-12-15 (×2): 30 mg via ORAL
  Filled 2020-12-14 (×3): qty 1

## 2020-12-14 MED ORDER — NIFEDIPINE 10 MG PO CAPS
10.0000 mg | ORAL_CAPSULE | ORAL | Status: AC | PRN
  Administered 2020-12-14 (×4): 10 mg via ORAL
  Filled 2020-12-14 (×4): qty 1

## 2020-12-14 MED ORDER — CYCLOBENZAPRINE HCL 5 MG PO TABS: 10.0000 mg | ORAL_TABLET | Freq: Three times a day (TID) | ORAL | Status: DC | PRN

## 2020-12-14 MED ORDER — SODIUM CHLORIDE 0.9 % IV SOLN
2.0000 g | Freq: Four times a day (QID) | INTRAVENOUS | Status: DC
  Administered 2020-12-14 – 2020-12-15 (×6): 2 g via INTRAVENOUS
  Filled 2020-12-14 (×7): qty 2000

## 2020-12-14 MED ORDER — DOCUSATE SODIUM 100 MG PO CAPS: 100.0000 mg | ORAL_CAPSULE | Freq: Every day | ORAL | Status: DC

## 2020-12-14 MED ORDER — AMOXICILLIN 500 MG PO CAPS: 500.0000 mg | ORAL_CAPSULE | Freq: Three times a day (TID) | ORAL | Status: DC

## 2020-12-14 MED ORDER — NIFEDIPINE 10 MG PO CAPS: 10.0000 mg | ORAL_CAPSULE | Freq: Four times a day (QID) | ORAL | Status: DC | PRN

## 2020-12-14 MED ORDER — SERTRALINE HCL 25 MG PO TABS
25.0000 mg | ORAL_TABLET | Freq: Every day | ORAL | Status: DC
  Administered 2020-12-14 – 2020-12-17 (×4): 25 mg via ORAL
  Filled 2020-12-14 (×4): qty 1

## 2020-12-14 MED ORDER — ZOLPIDEM TARTRATE 5 MG PO TABS
5.0000 mg | ORAL_TABLET | Freq: Every evening | ORAL | Status: DC | PRN
  Filled 2020-12-14: qty 1

## 2020-12-14 NOTE — Progress Notes (Signed)
OB Note  CTSP for ? Of SROM  Patient had just voided but then when was at sink at fluid leak out. Pt states it looked clear, non bloody and as far as she can tell, none since. Having some cramps (stable)  SSE: vagina with no blood or d/c in vault or fluid, negative cough test. Cervix visually closed with old brown mucus at the os  amnisure obtained Fern slide negative  Likely not SROMed. F/u Suzanne Hampshire MD Attending [facility] for [employer] (Faculty Practice) 12/14/2020 Time: [phone]

## 2020-12-14 NOTE — Progress Notes (Signed)
OB Note  CTSP for increased cramping  Patient states it's starting to subside. Old blood when got up to bathroom  Fetus category I with accels. No definite UCs seen on toco  Abdomen: soft, nttp SSE: scant old blood in the vault, no fluid, cervix visually closed SVE: deferred  A/P: pt stable I told her I do recommend a BMZ course which she is amenable to. Am fasting and 2h PP ordered and patient switched to carb modified diet Pt on home labetalol but BPs look okay. Will continue to follow. If needs meds, would try procardia xl first  Cornelia Copa MD Attending [facility] [employer] (Faculty [facility]) 12/14/2020 Time: [phone]

## 2020-12-14 NOTE — MAU Provider Note (Addendum)
Chief Complaint:  Vaginal Bleeding   Event Date/Time   First Provider Initiated Contact with Patient 12/13/20 2322     HPI: Suzanne James is a 25 y.o. G3P2002 at 37w5dwho presents to maternity admissions reporting vaginal bleeding (recurrent).  Today had blood dripping into toilet, whereas the other day it was just brown spotting.  Not sure if she feels contractions, states feels like "baby is stretching", though occasionally they hurt. .  Was seen for spotting on .12/11/20 and discharged home.  She reports good fetal movement, denies LOF, vaginal itching/burning, urinary symptoms, h/a, dizziness, n/v, diarrhea, constipation or fever/chills.    Vaginal Bleeding The patient's primary symptoms include vaginal bleeding. The patient's pertinent negatives include no genital itching, genital lesions, genital odor or pelvic pain. This is a recurrent problem. The current episode started in the past 7 days. The problem occurs intermittently. The problem has been unchanged. The patient is experiencing no pain. She is pregnant. Pertinent negatives include no abdominal pain, back pain, constipation, diarrhea, dysuria, fever, frequency, nausea or vomiting. The vaginal discharge was bloody. The vaginal bleeding is lighter than menses. She has not been passing clots. She has not been passing tissue. Nothing aggravates the symptoms. She has tried nothing for the symptoms. She is not sexually active.   RN Note: Pt states tonight around 2030 she noticed vaginal bleeding mixed with mucous, also some blood in the toilet. She had been evaluated for spotting yesterday. States she is on pelvic rest. Also thinks shes having contractions but is not sure. She denies LOF, +FM   Past Medical History: Past Medical History:  Diagnosis Date  . Strep throat     Past obstetric history: OB History  Gravida Para Term Preterm AB Living  3 2 2     2   SAB IAB Ectopic Multiple Live Births          2    # Outcome Date GA Lbr  Len/2nd Weight Sex Delivery Anes PTL Lv  3 Current           2 Term 09/27/17 [redacted]w[redacted]d  2892 g F Vag-Spont EPI N LIV  1 Term 05/20/15 [redacted]w[redacted]d  2495 g F Vag-Spont EPI N LIV     Complications: IUGR (intrauterine growth retardation) of newborn, Gestational diabetes, Gestational hypertension    Past Surgical History: Past Surgical History:  Procedure Laterality Date  . TONSILLECTOMY      Family History: Family History  Problem Relation Age of Onset  . Asthma Sister   . Hypertension Sister   . Heart attack Maternal Grandmother   . Hypertension Paternal Grandmother   . Diabetes Paternal Grandmother     Social History: Social History   Tobacco Use  . Smoking status: Never Smoker  . Smokeless tobacco: Never Used  Vaping Use  . Vaping Use: Never used  Substance Use Topics  . Alcohol use: No  . Drug use: No    Allergies: No Known Allergies  Meds:  Medications Prior to Admission  Medication Sig Dispense Refill Last Dose  . aspirin EC 81 MG tablet Take 2 tablets (162 mg total) by mouth daily. 60 tablet 6 12/13/2020 at Unknown time  . ferrous sulfate 325 (65 FE) MG tablet Take 1 tablet (325 mg total) by mouth every other day. 45 tablet 2 12/12/2020 at Unknown time  . glyBURIDE (DIABETA) 5 MG tablet TAKE 1 TABLET BY MOUTH EVERYDAY AT BEDTIME 30 tablet 2 12/13/2020 at Unknown time  . labetalol (NORMODYNE) 100 MG tablet Take  1 tablet (100 mg total) by mouth 2 (two) times daily. 60 tablet 6 12/13/2020 at Unknown time  . Prenatal Vit-Fe Fumarate-FA (MULTIVITAMIN-PRENATAL) 27-0.8 MG TABS tablet Take 1 tablet by mouth daily at 12 noon.   12/13/2020 at Unknown time  . sertraline (ZOLOFT) 25 MG tablet TAKE 1 TABLET BY MOUTH EVERY DAY 90 tablet 3 12/13/2020 at Unknown time  . Accu-Chek Softclix Lancets lancets Use as instructed to check blood sugar 4 times daily 100 each 12   . glucose blood (ACCU-CHEK GUIDE) test strip QS for QID testing 200 strip 12     I have reviewed patient's Past Medical Hx,  Surgical Hx, Family Hx, Social Hx, medications and allergies.   ROS:  Review of Systems  Constitutional: Negative for fever.  Gastrointestinal: Negative for abdominal pain, constipation, diarrhea, nausea and vomiting.  Genitourinary: Positive for vaginal bleeding. Negative for dysuria, frequency and pelvic pain.  Musculoskeletal: Negative for back pain.   Other systems negative  Physical Exam   Patient Vitals for the past 24 hrs:  BP Temp Temp src Pulse Resp SpO2 Height Weight  12/14/20 0105 (!) 118/58 -- -- -- -- -- -- --  12/14/20 0031 116/61 -- -- 89 -- -- -- --  12/14/20 0020 135/63 -- -- 81 -- -- -- --  12/14/20 0001 131/65 -- -- 77 -- -- -- --  12/13/20 2352 128/69 -- -- -- -- -- -- --  12/13/20 2346 128/69 -- -- 79 -- -- -- --  12/13/20 2316 (!) 144/80 -- -- 86 -- -- -- --  12/13/20 2305 -- -- -- -- -- 99 % -- --  12/13/20 2302 (!) 141/82 -- -- 84 -- -- -- --  12/13/20 2300 -- -- -- -- -- 99 % -- --  12/13/20 2255 -- -- -- -- -- 99 % -- --  12/13/20 2250 -- -- -- -- -- 99 % -- --  12/13/20 2245 -- -- -- -- -- 99 % -- --  12/13/20 2240 -- -- -- -- -- 99 % -- --  12/13/20 2239 (!) 143/81 -- -- 87 -- -- -- --  12/13/20 2238 (!) 143/91 -- -- -- -- -- -- --  12/13/20 2237 -- 98 F (36.7 C) Oral 82 16 100 % -- --  12/13/20 2230 -- -- -- -- -- -- 5\' 3"  (1.6 m) 98.1 kg   Constitutional: Well-developed, well-nourished female in no acute distress.  Cardiovascular: normal rate and rhythm Respiratory: normal effort GI: Abd soft, non-tender, gravid appropriate for gestational age.   No rebound or guarding. MS: Extremities nontender, no edema, normal ROM Neurologic: Alert and oriented x 4.  GU: Neg CVAT.  PELVIC EXAM: Cervix pink, visually closed, without lesion, small clotted dark red bloody discharge, not actively flowing from os, but took 2-3 fox swabs to clean out, vaginal walls and external genitalia normal   FHT:  Baseline 135 , moderate variability, accelerations present,  no decelerations Contractions: Irregular mild contractions    Labs:   O/Positive/-- (11/04 1555)  Imaging:  02-11-1972 done on 12/11/20 showed no previa  MAU Course/MDM: NST reviewed, reassuring with contractions every 3 min, but short in duration Consult Dr 12/13/20 with presentation, exam findings and test results.  Treatments in MAU included Procardia series. .    Assessment: Single IUP at [redacted]w[redacted]d Preterm uterine contractions Vaginal bleeding, second episode  Plan: Admit to OBSCU Observation at least overnight Finish Procardia series then will order PRN Pad count MD to follow  [redacted]w[redacted]d  CNM, MSN Certified Nurse-Midwife 12/14/2020 1:08 AM

## 2020-12-14 NOTE — H&P (Signed)
Chief Complaint:  Vaginal Bleeding   Event Date/Time   First Provider Initiated Contact with Patient 12/13/20 2322     HPI: Suzanne James is a 25 y.o. G3P2002 at 37w5dwho presents to maternity admissions reporting vaginal bleeding (recurrent).  Today had blood dripping into toilet, whereas the other day it was just brown spotting.  Not sure if she feels contractions, states feels like "baby is stretching", though occasionally they hurt. .  Was seen for spotting on .12/11/20 and discharged home.  She reports good fetal movement, denies LOF, vaginal itching/burning, urinary symptoms, h/a, dizziness, n/v, diarrhea, constipation or fever/chills.    Vaginal Bleeding The patient's primary symptoms include vaginal bleeding. The patient's pertinent negatives include no genital itching, genital lesions, genital odor or pelvic pain. This is a recurrent problem. The current episode started in the past 7 days. The problem occurs intermittently. The problem has been unchanged. The patient is experiencing no pain. She is pregnant. Pertinent negatives include no abdominal pain, back pain, constipation, diarrhea, dysuria, fever, frequency, nausea or vomiting. The vaginal discharge was bloody. The vaginal bleeding is lighter than menses. She has not been passing clots. She has not been passing tissue. Nothing aggravates the symptoms. She has tried nothing for the symptoms. She is not sexually active.   RN Note: Pt states tonight around 2030 she noticed vaginal bleeding mixed with mucous, also some blood in the toilet. She had been evaluated for spotting yesterday. States she is on pelvic rest. Also thinks shes having contractions but is not sure. She denies LOF, +FM   Past Medical History: Past Medical History:  Diagnosis Date  . Strep throat     Past obstetric history: OB History  Gravida Para Term Preterm AB Living  3 2 2     2   SAB IAB Ectopic Multiple Live Births          2    # Outcome Date GA Lbr  Len/2nd Weight Sex Delivery Anes PTL Lv  3 Current           2 Term 09/27/17 [redacted]w[redacted]d  2892 g F Vag-Spont EPI N LIV  1 Term 05/20/15 [redacted]w[redacted]d  2495 g F Vag-Spont EPI N LIV     Complications: IUGR (intrauterine growth retardation) of newborn, Gestational diabetes, Gestational hypertension    Past Surgical History: Past Surgical History:  Procedure Laterality Date  . TONSILLECTOMY      Family History: Family History  Problem Relation Age of Onset  . Asthma Sister   . Hypertension Sister   . Heart attack Maternal Grandmother   . Hypertension Paternal Grandmother   . Diabetes Paternal Grandmother     Social History: Social History   Tobacco Use  . Smoking status: Never Smoker  . Smokeless tobacco: Never Used  Vaping Use  . Vaping Use: Never used  Substance Use Topics  . Alcohol use: No  . Drug use: No    Allergies: No Known Allergies  Meds:  Medications Prior to Admission  Medication Sig Dispense Refill Last Dose  . aspirin EC 81 MG tablet Take 2 tablets (162 mg total) by mouth daily. 60 tablet 6 12/13/2020 at Unknown time  . ferrous sulfate 325 (65 FE) MG tablet Take 1 tablet (325 mg total) by mouth every other day. 45 tablet 2 12/12/2020 at Unknown time  . glyBURIDE (DIABETA) 5 MG tablet TAKE 1 TABLET BY MOUTH EVERYDAY AT BEDTIME 30 tablet 2 12/13/2020 at Unknown time  . labetalol (NORMODYNE) 100 MG tablet Take  1 tablet (100 mg total) by mouth 2 (two) times daily. 60 tablet 6 12/13/2020 at Unknown time  . Prenatal Vit-Fe Fumarate-FA (MULTIVITAMIN-PRENATAL) 27-0.8 MG TABS tablet Take 1 tablet by mouth daily at 12 noon.   12/13/2020 at Unknown time  . sertraline (ZOLOFT) 25 MG tablet TAKE 1 TABLET BY MOUTH EVERY DAY 90 tablet 3 12/13/2020 at Unknown time  . Accu-Chek Softclix Lancets lancets Use as instructed to check blood sugar 4 times daily 100 each 12   . glucose blood (ACCU-CHEK GUIDE) test strip QS for QID testing 200 strip 12     I have reviewed patient's Past Medical Hx,  Surgical Hx, Family Hx, Social Hx, medications and allergies.   ROS:  Review of Systems  Constitutional: Negative for fever.  Gastrointestinal: Negative for abdominal pain, constipation, diarrhea, nausea and vomiting.  Genitourinary: Positive for vaginal bleeding. Negative for dysuria, frequency and pelvic pain.  Musculoskeletal: Negative for back pain.   Other systems negative  Physical Exam   Patient Vitals for the past 24 hrs:  BP Temp Temp src Pulse Resp SpO2 Height Weight  12/14/20 0105 (!) 118/58 -- -- -- -- -- -- --  12/14/20 0031 116/61 -- -- 89 -- -- -- --  12/14/20 0020 135/63 -- -- 81 -- -- -- --  12/14/20 0001 131/65 -- -- 77 -- -- -- --  12/13/20 2352 128/69 -- -- -- -- -- -- --  12/13/20 2346 128/69 -- -- 79 -- -- -- --  12/13/20 2316 (!) 144/80 -- -- 86 -- -- -- --  12/13/20 2305 -- -- -- -- -- 99 % -- --  12/13/20 2302 (!) 141/82 -- -- 84 -- -- -- --  12/13/20 2300 -- -- -- -- -- 99 % -- --  12/13/20 2255 -- -- -- -- -- 99 % -- --  12/13/20 2250 -- -- -- -- -- 99 % -- --  12/13/20 2245 -- -- -- -- -- 99 % -- --  12/13/20 2240 -- -- -- -- -- 99 % -- --  12/13/20 2239 (!) 143/81 -- -- 87 -- -- -- --  12/13/20 2238 (!) 143/91 -- -- -- -- -- -- --  12/13/20 2237 -- 98 F (36.7 C) Oral 82 16 100 % -- --  12/13/20 2230 -- -- -- -- -- -- 5' 3" (1.6 m) 98.1 kg   Constitutional: Well-developed, well-nourished female in no acute distress.  Cardiovascular: normal rate and rhythm Respiratory: normal effort GI: Abd soft, non-tender, gravid appropriate for gestational age.   No rebound or guarding. MS: Extremities nontender, no edema, normal ROM Neurologic: Alert and oriented x 4.  GU: Neg CVAT.  PELVIC EXAM: Cervix pink, visually closed, without lesion, small clotted dark red bloody discharge, not actively flowing from os, but took 2-3 fox swabs to clean out, vaginal walls and external genitalia normal   FHT:  Baseline 135 , moderate variability, accelerations present,  no decelerations Contractions: Irregular mild contractions    Labs:   O/Positive/-- (11/04 1555)  Imaging:  US done on 12/11/20 showed no previa  MAU Course/MDM: NST reviewed, reassuring with contractions every 3 min, but short in duration Consult Dr Arnold with presentation, exam findings and test results.  Treatments in MAU included Procardia series. .    Assessment: Single IUP at [redacted]w[redacted]d Preterm uterine contractions Vaginal bleeding, second episode  Plan: Admit to OBSCU Observation at least overnight Finish Procardia series then will order PRN Pad count MD to follow  Ellianah Cordy   CNM, MSN Certified Nurse-Midwife 12/14/2020 1:08 AM 

## 2020-12-14 NOTE — Progress Notes (Signed)
OB Note  +Amnisure. Results d/w pt and plan of care with PPROM. GBS obtained and will start latency abx and inform NICU. I d/w her re: standard is delivery at 34wks but can talk to her more about this tomorrow or Monday with risk of infection vs prematurity d/w her in re: to continuing pregnancy past 34wks. As long as fetal status is reassuring and remains cephalic, she should be fine for trial of labor for vag delivery  Cornelia Copa MD Attending Center for [employer] (Faculty Practice) 12/14/2020 Time: [phone]

## 2020-12-15 DIAGNOSIS — O42913 Preterm premature rupture of membranes, unspecified as to length of time between rupture and onset of labor, third trimester: Principal | ICD-10-CM

## 2020-12-15 DIAGNOSIS — O24425 Gestational diabetes mellitus in childbirth, controlled by oral hypoglycemic drugs: Secondary | ICD-10-CM | POA: Diagnosis present

## 2020-12-15 DIAGNOSIS — O43123 Velamentous insertion of umbilical cord, third trimester: Secondary | ICD-10-CM | POA: Diagnosis present

## 2020-12-15 DIAGNOSIS — O4693 Antepartum hemorrhage, unspecified, third trimester: Secondary | ICD-10-CM | POA: Diagnosis present

## 2020-12-15 DIAGNOSIS — Z8759 Personal history of other complications of pregnancy, childbirth and the puerperium: Secondary | ICD-10-CM

## 2020-12-15 DIAGNOSIS — Z3A33 33 weeks gestation of pregnancy: Secondary | ICD-10-CM

## 2020-12-15 DIAGNOSIS — O99344 Other mental disorders complicating childbirth: Secondary | ICD-10-CM | POA: Diagnosis present

## 2020-12-15 DIAGNOSIS — O1002 Pre-existing essential hypertension complicating childbirth: Secondary | ICD-10-CM | POA: Diagnosis present

## 2020-12-15 DIAGNOSIS — Z3A34 34 weeks gestation of pregnancy: Secondary | ICD-10-CM | POA: Diagnosis not present

## 2020-12-15 DIAGNOSIS — Z8751 Personal history of pre-term labor: Secondary | ICD-10-CM

## 2020-12-15 DIAGNOSIS — Z79899 Other long term (current) drug therapy: Secondary | ICD-10-CM | POA: Diagnosis not present

## 2020-12-15 DIAGNOSIS — E876 Hypokalemia: Secondary | ICD-10-CM | POA: Diagnosis present

## 2020-12-15 DIAGNOSIS — O99284 Endocrine, nutritional and metabolic diseases complicating childbirth: Secondary | ICD-10-CM | POA: Diagnosis present

## 2020-12-15 DIAGNOSIS — F32A Depression, unspecified: Secondary | ICD-10-CM | POA: Diagnosis present

## 2020-12-15 DIAGNOSIS — F419 Anxiety disorder, unspecified: Secondary | ICD-10-CM | POA: Diagnosis present

## 2020-12-15 LAB — GLUCOSE, CAPILLARY
Glucose-Capillary: 107 mg/dL — ABNORMAL HIGH (ref 70–99)
Glucose-Capillary: 184 mg/dL — ABNORMAL HIGH (ref 70–99)
Glucose-Capillary: 184 mg/dL — ABNORMAL HIGH (ref 70–99)
Glucose-Capillary: 124 mg/dL — ABNORMAL HIGH (ref 70–99)

## 2020-12-15 MED ORDER — POLYETHYLENE GLYCOL 3350 17 G PO PACK
17.0000 g | PACK | Freq: Two times a day (BID) | ORAL | Status: DC
  Administered 2020-12-15: 17 g via ORAL
  Filled 2020-12-15 (×3): qty 1

## 2020-12-15 NOTE — Progress Notes (Addendum)
Daily Antepartum Note  Admission Date: 12/13/2020 Current Date: 12/15/2020 9:45 AM  Suzanne James is a 25 y.o. [id number] @ 101w6d, HD#2, admitted for VB. Patient PPROM'ed on the day of admission  Pregnancy complicated by: Patient Active Problem List   Diagnosis Date Noted  . Vaginal bleeding in pregnancy, third trimester 12/14/2020  . Gestational diabetes 11/05/2020  . Chronic hypertension affecting pregnancy 08/15/2020  . History of gestational diabetes in prior pregnancy, currently pregnant 07/16/2020  . History of gestational hypertension 07/16/2020  . Supervision of high-risk pregnancy 07/16/2020  . Anxiety 06/14/2020    Overnight/24hr events:  Irregular UCs BPs meds restarted  Subjective:  No current s/s of preterm labor. +LOF clear, no bleeding  Objective:     Current Vital Signs 24h Vital Sign Ranges  T 98.1 F (36.7 C) Temp  Avg: 98 F (36.7 C)  Min: 97.8 F (36.6 C)  Max: 98.5 F (36.9 C)  BP 137/73 BP  Min: 118/67  Max: 145/86  HR 92 Pulse  Avg: 93.4  Min: 74  Max: 108  RR 16 Resp  Avg: 16.8  Min: 16  Max: 18  SaO2 97 % Room Air SpO2  Avg: 98.4 %  Min: 95 %  Max: 100 %       24 Hour I/O Current Shift I/O  Time Ins Outs 04/02 0701 - 04/03 0700 In: 1100  Out: -  No intake/output data recorded.   Patient Vitals for the past 24 hrs:  BP Temp Temp src Pulse Resp SpO2  12/15/20 0730 137/73 98.1 F (36.7 C) Oral 92 16 97 %  12/15/20 0349 121/61 98 F (36.7 C) Oral 88 16 95 %  12/14/20 2317 (!) 145/86 -- -- (!) 108 -- --  12/14/20 2315 (!) 145/86 97.9 F (36.6 C) Oral (!) 108 18 100 %  12/14/20 1937 118/67 98.5 F (36.9 C) Oral 97 17 100 %  12/14/20 1652 (!) 141/71 97.8 F (36.6 C) Oral 87 16 --  12/14/20 1137 129/68 97.9 F (36.6 C) Oral 74 18 100 %   FHT: 140 baseline, +accels, no decel, moderate variability Toco: rare contractions  Physical exam: General: Well nourished, well developed female in no acute distress. Abdomen: gravid nttp Respiratory:  no respiratory distress Extremities: no clubbing, cyanosis or edema Skin: Warm and dry.   Medications: Current Facility-Administered Medications  Medication Dose Route Frequency Provider Last Rate Last Admin  . 0.9 %  sodium chloride infusion  250 mL Intravenous PRN Aviva Signs, CNM 10 mL/hr at 12/14/20 1531 250 mL at 12/14/20 1531  . acetaminophen (TYLENOL) tablet 650 mg  650 mg Oral Q4H PRN Aviva Signs, CNM   650 mg at 12/15/20 0136  . ampicillin (OMNIPEN) 2 g in sodium chloride 0.9 % 100 mL IVPB  2 g Intravenous Q6H [place] Bing, MD 300 mL/hr at 12/15/20 0346 2 g at 12/15/20 0346   Followed by  . [START ON 12/16/2020] amoxicillin (AMOXIL) capsule 500 mg  500 mg Oral TID [place] Bing, MD      . betamethasone acetate-betamethasone sodium phosphate (CELESTONE) injection 12 mg  12 mg Intramuscular Q24 Hr x 2 Lolita Bing, MD   12 mg at 12/14/20 0951  . calcium carbonate (TUMS - dosed in mg elemental calcium) chewable tablet 400 mg of elemental calcium  2 tablet Oral Q4H PRN Aviva Signs, CNM      . cyclobenzaprine (FLEXERIL) tablet 10 mg  10 mg Oral Q8H PRN Aviva Signs, CNM      .  docusate sodium (COLACE) capsule 100 mg  100 mg Oral BID PRN [place] Bing, MD      . NIFEdipine (PROCARDIA) capsule 10 mg  10 mg Oral Q6H PRN Aviva Signs, CNM      . NIFEdipine (PROCARDIA-XL/NIFEDICAL-XL) 24 hr tablet 30 mg  30 mg Oral Daily Godley Bing, MD   30 mg at 12/14/20 2347  . polyethylene glycol (MIRALAX / GLYCOLAX) packet 17 g  17 g Oral BID [place] Bing, MD      . prenatal multivitamin tablet 1 tablet  1 tablet Oral Q1200 Aviva Signs, CNM   1 tablet at 12/14/20 1201  . sertraline (ZOLOFT) tablet 25 mg  25 mg Oral Daily  Bing, MD   25 mg at 12/14/20 1336  . sodium chloride flush (NS) 0.9 % injection 3 mL  3 mL Intravenous Q12H Aviva Signs, CNM   3 mL at 12/14/20 0951  . sodium chloride flush (NS) 0.9 % injection 3 mL  3 mL Intravenous  PRN Aviva Signs, CNM      . zolpidem (AMBIEN) tablet 5 mg  5 mg Oral QHS PRN Aviva Signs, CNM        Labs:  Recent Labs  Lab 12/14/20 0143  WBC 14.1*  HGB 10.7*  HCT 32.6*  PLT 262   Results for MARSHELL, RIEGER (MRN [id number]) as of 12/15/2020 09:52  Ref. Range 12/14/2020 10:12 12/14/2020 13:12 12/14/2020 14:40 12/14/2020 21:09 12/15/2020 05:53  Glucose-Capillary Latest Ref Range: 70 - 99 mg/dL 443 (H)  154 (H) 008 (H) 107 (H)   Radiology:  No new imaging 3/30: cephalic, afi 9, normal placenta 3/21: 52%, 1968gm, ac normal cepahlic  Assessment & Plan:  Pt stable *Pregnancy: reactive NST. qday NSTs *PPROM: D#2 latency abx. I d/w her re: standard recommend is 34/0 delivery with PPROM but can continue after 34wks if patient is okay with increasing risk of infection and as long as maternal and fetal status is reassuring. I told patient we will talk to her after she sees NICU and gets information from them before finalizing delivery timing -4/2: visually closed at [facility] dx. 1cm external and closed internally on admit exam *Preterm: bmz #2 today. nicu consult later today *cHTN: restarted on meds yesterday; was held due to low BPs on admission *GDMa2: was on glyburide as an outpatient but on no meds currently.  *PPx: SCDs, OOB ad lib *FEN/GI: SLIV *Dispo: inpatient until delivery (see above)  Cornelia Copa MD Attending Center for [facility] (Faculty Practice) GYN Consult Phone: [phone] (M-F, [phone]) & [phone] (Off hours, weekends, holidays)

## 2020-12-15 NOTE — Consult Note (Signed)
Neonatology Consult to Antenatal Patient:  I was asked by Dr. Alysia Penna to see this patient in order to provide antenatal counseling due to prematurity.  Mrs Suzanne James was admitted 12/14/20 at 54 5/[redacted] weeks GA for recurrent vaginal bleeding and new onset PTL.  Now also PROM shortly after arrival.  No previa by [place]. Reassuring fetal status. EFW has been ~50%tile. She is BTMX complete and is receiving prophylactic IV Ampicillin, Procardia.  Pregnancy also complicated by GDM on glyburide, CHTN, and anxiety.  They had lots of questions about the benefits and risks regarding timing of delivery, tomorrow vs waiting a few days vs waiting until 35 weeks.    I spoke with the patient and her husband. We discussed potential planned delivery as early as tomorrow vs expectant management unless clinical concerns warrant immediate delivery.  I reviewed usual DR management, possible respiratory complications and need for support, IV access, feedings (mother desires breast feeding, which was encouraged and they agree to donor BM use), LOS, Mortality and Morbidity, and long term outcomes.  They also are aware the potential benefits vs risks of waiting until delivery at 35 weeks to avoid automatic NICU admission including the role mother's co-morbidities may play in placing baby at higher risk for needing management in NICU.  They expressed reduced anxiety and better understanding, agreement and appreciation for the discussion.  They feel they can now talk privately and can make an informed decision about the timing of delivery.    Thank you for asking me to see this patient.  Dineen Kid Leary Roca, MD Neonatologist  The total length of face-to-face or floor/unit time for this encounter was 25 minutes. Counseling and/or coordination of care was 40 minutes of the above.

## 2020-12-15 NOTE — Progress Notes (Signed)
Patient did not eat lunch stating "I wasn't hungry," therefore a 2 hour post-prandial CBG was not obtained.

## 2020-12-16 ENCOUNTER — Encounter: Payer: BC Managed Care – PPO | Admitting: Women's Health

## 2020-12-16 ENCOUNTER — Telehealth: Payer: Self-pay | Admitting: *Deleted

## 2020-12-16 ENCOUNTER — Encounter (HOSPITAL_COMMUNITY): Payer: Self-pay | Admitting: Obstetrics and Gynecology

## 2020-12-16 ENCOUNTER — Telehealth: Payer: Self-pay | Admitting: Women's Health

## 2020-12-16 ENCOUNTER — Other Ambulatory Visit: Payer: BC Managed Care – PPO

## 2020-12-16 LAB — GLUCOSE, CAPILLARY
Glucose-Capillary: 126 mg/dL — ABNORMAL HIGH (ref 70–99)
Glucose-Capillary: 121 mg/dL — ABNORMAL HIGH (ref 70–99)
Glucose-Capillary: 123 mg/dL — ABNORMAL HIGH (ref 70–99)
Glucose-Capillary: 133 mg/dL — ABNORMAL HIGH (ref 70–99)
Glucose-Capillary: 125 mg/dL — ABNORMAL HIGH (ref 70–99)
Glucose-Capillary: 113 mg/dL — ABNORMAL HIGH (ref 70–99)
Glucose-Capillary: 160 mg/dL — ABNORMAL HIGH (ref 70–99)
Glucose-Capillary: 128 mg/dL — ABNORMAL HIGH (ref 70–99)
Glucose-Capillary: 94 mg/dL (ref 70–99)

## 2020-12-16 LAB — COMPREHENSIVE METABOLIC PANEL
ALT: 11 U/L (ref 0–44)
AST: 19 U/L (ref 15–41)
Albumin: 2.8 g/dL — ABNORMAL LOW (ref 3.5–5.0)
Alkaline Phosphatase: 92 U/L (ref 38–126)
Anion gap: 8 (ref 5–15)
BUN: 6 mg/dL (ref 6–20)
CO2: 23 mmol/L (ref 22–32)
Calcium: 8.9 mg/dL (ref 8.9–10.3)
Chloride: 107 mmol/L (ref 98–111)
Creatinine, Ser: 0.5 mg/dL (ref 0.44–1.00)
GFR, Estimated: >60 mL/min (ref >60)
Glucose, Bld: 117 mg/dL — ABNORMAL HIGH (ref 70–99)
Potassium: 3.2 mmol/L — ABNORMAL LOW (ref 3.5–5.1)
Sodium: 138 mmol/L (ref 135–145)
Total Bilirubin: 0.8 mg/dL (ref 0.3–1.2)
Total Protein: 6 g/dL — ABNORMAL LOW (ref 6.5–8.1)

## 2020-12-16 LAB — CBC
HCT: 31.3 % — ABNORMAL LOW (ref 36.0–46.0)
Hemoglobin: 10.5 g/dL — ABNORMAL LOW (ref 12.0–15.0)
MCH: 28.6 pg (ref 26.0–34.0)
MCHC: 33.5 g/dL (ref 30.0–36.0)
MCV: 85.3 fL (ref 80.0–100.0)
Platelets: 261 10*3/uL (ref 150–400)
RBC: 3.67 MIL/uL — ABNORMAL LOW (ref 3.87–5.11)
RDW: 13.5 % (ref 11.5–15.5)
WBC: 14.7 10*3/uL — ABNORMAL HIGH (ref 4.0–10.5)
nRBC: 0 % (ref 0.0–0.2)

## 2020-12-16 LAB — TYPE AND SCREEN
ABO/RH(D): O POS
ABO/RH(D): O POS
Antibody Screen: NEGATIVE
Antibody Screen: NEGATIVE

## 2020-12-16 LAB — RPR: RPR Ser Ql: NONREACTIVE

## 2020-12-16 MED ORDER — PENICILLIN G POT IN DEXTROSE 60000 UNIT/ML IV SOLN
3.0000 10*6.[IU] | INTRAVENOUS | Status: DC
  Administered 2020-12-16 – 2020-12-17 (×10): 3 10*6.[IU] via INTRAVENOUS
  Filled 2020-12-16 (×10): qty 50

## 2020-12-16 MED ORDER — EPHEDRINE 5 MG/ML INJ: 10.0000 mg | INTRAVENOUS | Status: DC | PRN

## 2020-12-16 MED ORDER — TERBUTALINE SULFATE 1 MG/ML IJ SOLN: 0.2500 mg | Freq: Once | INTRAMUSCULAR | Status: DC | PRN

## 2020-12-16 MED ORDER — FENTANYL CITRATE (PF) 100 MCG/2ML IJ SOLN: 50.0000 ug | INTRAMUSCULAR | Status: DC | PRN

## 2020-12-16 MED ORDER — LIDOCAINE HCL (PF) 1 % IJ SOLN: 30.0000 mL | INTRAMUSCULAR | Status: DC | PRN

## 2020-12-16 MED ORDER — ONDANSETRON HCL 4 MG/2ML IJ SOLN: 4.0000 mg | Freq: Four times a day (QID) | INTRAMUSCULAR | Status: DC | PRN

## 2020-12-16 MED ORDER — LACTATED RINGERS IV SOLN: 500.0000 mL | INTRAVENOUS | Status: DC | PRN

## 2020-12-16 MED ORDER — LABETALOL HCL 100 MG PO TABS
100.0000 mg | ORAL_TABLET | Freq: Two times a day (BID) | ORAL | Status: DC
  Administered 2020-12-16 – 2020-12-17 (×3): 100 mg via ORAL
  Filled 2020-12-16 (×4): qty 1

## 2020-12-16 MED ORDER — OXYTOCIN-SODIUM CHLORIDE 30-0.9 UT/500ML-% IV SOLN
1.0000 m[IU]/min | INTRAVENOUS | Status: DC
  Administered 2020-12-17: 2 m[IU]/min via INTRAVENOUS
  Filled 2020-12-16: qty 500

## 2020-12-16 MED ORDER — SOD CITRATE-CITRIC ACID 500-334 MG/5ML PO SOLN: 30.0000 mL | ORAL | Status: DC | PRN

## 2020-12-16 MED ORDER — ZOLPIDEM TARTRATE 5 MG PO TABS
5.0000 mg | ORAL_TABLET | Freq: Once | ORAL | Status: AC
  Administered 2020-12-16: 5 mg via ORAL

## 2020-12-16 MED ORDER — FENTANYL CITRATE (PF) 100 MCG/2ML IJ SOLN
INTRAMUSCULAR | Status: AC
  Administered 2020-12-16: 25 ug via INTRAVENOUS
  Filled 2020-12-16: qty 2

## 2020-12-16 MED ORDER — LACTATED RINGERS IV SOLN: INTRAVENOUS | Status: DC

## 2020-12-16 MED ORDER — MISOPROSTOL 50MCG HALF TABLET
50.0000 ug | ORAL_TABLET | ORAL | Status: DC | PRN
Start: 2020-12-16 — End: 2020-12-17
  Administered 2020-12-16 – 2020-12-17 (×5): 50 ug via BUCCAL
  Filled 2020-12-16 (×6): qty 1

## 2020-12-16 MED ORDER — FENTANYL CITRATE (PF) 100 MCG/2ML IJ SOLN
25.0000 ug | Freq: Once | INTRAMUSCULAR | Status: AC
Start: 2020-12-16 — End: 2020-12-16

## 2020-12-16 MED ORDER — MISOPROSTOL 25 MCG QUARTER TABLET: 25.0000 ug | ORAL_TABLET | Freq: Once | ORAL | Status: AC

## 2020-12-16 MED ORDER — INSULIN REGULAR(HUMAN) IN NACL 100-0.9 UT/100ML-% IV SOLN
INTRAVENOUS | Status: DC
  Administered 2020-12-16: 1.6 [IU]/h via INTRAVENOUS
  Administered 2020-12-17: 0.6 [IU]/h via INTRAVENOUS
  Filled 2020-12-16: qty 100

## 2020-12-16 MED ORDER — MISOPROSTOL 25 MCG QUARTER TABLET
ORAL_TABLET | ORAL | Status: AC
  Filled 2020-12-16: qty 1

## 2020-12-16 MED ORDER — DEXTROSE IN LACTATED RINGERS 5 % IV SOLN: INTRAVENOUS | Status: DC

## 2020-12-16 MED ORDER — DIPHENHYDRAMINE HCL 50 MG/ML IJ SOLN: 12.5000 mg | INTRAMUSCULAR | Status: DC | PRN

## 2020-12-16 MED ORDER — OXYTOCIN-SODIUM CHLORIDE 30-0.9 UT/500ML-% IV SOLN: 2.5000 [IU]/h | INTRAVENOUS | Status: DC

## 2020-12-16 MED ORDER — PHENYLEPHRINE 40 MCG/ML (10ML) SYRINGE FOR IV PUSH (FOR BLOOD PRESSURE SUPPORT): 80.0000 ug | PREFILLED_SYRINGE | INTRAVENOUS | Status: DC | PRN

## 2020-12-16 MED ORDER — FENTANYL-BUPIVACAINE-NACL 0.5-0.125-0.9 MG/250ML-% EP SOLN: 12.0000 mL/h | EPIDURAL | Status: DC | PRN

## 2020-12-16 MED ORDER — SODIUM CHLORIDE 0.9 % IV SOLN
5.0000 10*6.[IU] | Freq: Once | INTRAVENOUS | Status: AC
  Administered 2020-12-16: 5 10*6.[IU] via INTRAVENOUS
  Filled 2020-12-16: qty 5

## 2020-12-16 MED ORDER — OXYTOCIN BOLUS FROM INFUSION
333.0000 mL | Freq: Once | INTRAVENOUS | Status: AC
  Administered 2020-12-17: 333 mL via INTRAVENOUS

## 2020-12-16 MED ORDER — MISOPROSTOL 25 MCG QUARTER TABLET
ORAL_TABLET | ORAL | Status: AC
  Administered 2020-12-16: 25 ug via VAGINAL
  Filled 2020-12-16: qty 1

## 2020-12-16 MED ORDER — LACTATED RINGERS IV SOLN: 500.0000 mL | Freq: Once | INTRAVENOUS | Status: DC

## 2020-12-16 MED ORDER — MISOPROSTOL 25 MCG QUARTER TABLET
25.0000 ug | ORAL_TABLET | ORAL | Status: DC
  Administered 2020-12-16 – 2020-12-17 (×2): 25 ug via VAGINAL
  Filled 2020-12-16: qty 1

## 2020-12-16 MED ORDER — DEXTROSE 50 % IV SOLN: 0.0000 mL | INTRAVENOUS | Status: DC | PRN

## 2020-12-16 MED ORDER — INSULIN ASPART 100 UNIT/ML ~~LOC~~ SOLN: 3.0000 [IU] | Freq: Three times a day (TID) | SUBCUTANEOUS | Status: DC

## 2020-12-16 MED ORDER — ACETAMINOPHEN 325 MG PO TABS
650.0000 mg | ORAL_TABLET | ORAL | Status: DC | PRN
  Administered 2020-12-17: 650 mg via ORAL
  Filled 2020-12-16: qty 2

## 2020-12-16 NOTE — Progress Notes (Signed)
Patient Vitals for the past 4 hrs:  BP Temp Temp src Pulse Resp  12/16/20 2027 129/82 98.1 F (36.7 C) Oral 66 18  12/16/20 1854 138/79 98.5 F (36.9 C) Oral 65 --  12/16/20 1758 134/81 -- -- 62 --   Blood sugars 94/100 (not shown in chart as being taken q 1 hr, but RN looked at endotool and verified that they had been checked, most <120.  Foley fell out around 2000.  Cx 4.5/thick/-3.  Cytotec placed in post vaginal fornix    FHR Cat 1.  Continue endotool for blood sugar management.  Ambien 5mg  pO for rest.  Will start pitocin when cx thins.

## 2020-12-16 NOTE — Telephone Encounter (Signed)
Left message @ 11:32 am. JSY 

## 2020-12-16 NOTE — Telephone Encounter (Signed)
[facility] called to report elevated BP  BP 137/94 with headache - reported 12/13/2020 @ 6:50pm

## 2020-12-16 NOTE — Progress Notes (Signed)
Labor Progress Note Suzanne James is a 25 y.o. E4V4098 at [redacted]w[redacted]d presented for [facility] 12/14/20.   S: Doing well without complaints.  O:  BP 130/80   Pulse 74   Temp 98.7 F (37.1 C) (Oral)   Resp 18   Ht 5\' 3"  (1.6 m)   Wt 98.1 kg   LMP 04/14/2020   SpO2 100% Comment: room air  BMI 38.30 kg/m  EFM: 150/mod variability/pos accels/no decels Toco: intermittent   CVE: Dilation: Fingertip Effacement (%): Thick Cervical Position: Posterior Station: -3 Presentation: Vertex Exam by:: Dr. [email or web address] confirmed by BSUS   A&P: 25 y.o. 22 [redacted]w[redacted]d admitted for Vaginal bleeding and PPROM 4/2, now on L&D for induction of labor.   #IOL PPROM: s/p Cytotec x2. Given cervical exam will re-dose cytotec. Discussed possible FB placement, will defer at this time. Patient with history of painful FB placement. #PPROM: s/p latency abx. BMZ complete. S/p NICU consult.  #A2gdm: Previously on glyburide, elevated CBG for past few days, likely in setting of receiving BMZ. Continue endotool. Carb modified diet. #cHTN: Given no need for tocolysis will discontinue procardia and switch back to labetalol 100 mg BID (home regimen). Asymptomatic. No severe range BP. PreE labs nml.  #Anxiety: on zoloft 25 mg daily, ordered. Stable.  #Pain: per patient request #FWB: cat I  #GBS pending, PCN     [redacted]w[redacted]d, MD 12:16 PM

## 2020-12-16 NOTE — Telephone Encounter (Signed)
Pt's insurance has denied covering Glyburide 5 mg. JSY

## 2020-12-16 NOTE — Progress Notes (Signed)
Inpatient Diabetes Program Recommendations  ADA Standards of Care 2022 Diabetes in Pregnancy Target Glucose Ranges:  Fasting: 60 - 90 mg/dL Preprandial: 60 - 670 mg/dL 1 hr postprandial: Less than 140mg /dL (from first bite of meal) 2 hr postprandial: Less than 120 mg/dL (from first bit of meal)   Lab Results  Component Value Date   GLUCAP 125 (H) 12/16/2020   HGBA1C 5.5 07/18/2020    Review of Glycemic Control Results for ADINA, PUZZO (MRN Jannifer Rodney) as of 12/16/2020 09:18  Ref. Range 12/16/2020 03:51 12/16/2020 05:41 12/16/2020 06:18 12/16/2020 07:23 12/16/2020 08:53  Glucose-Capillary Latest Ref Range: 70 - 99 mg/dL 02/15/2021 (H) 438 (H) 887 (H) 133 (H) 125 (H)   Diabetes history: A2GDM Outpatient Diabetes medications: Glyburide 5 mg qhs Current orders for Inpatient glycemic control: Endotool/IV insulin Novolog 3 units tid with meals BMZ x 2 on 4/2, 4/3  Inpatient Diabetes Program Recommendations:    Following pt glucose trends. IV insulin for now. On delivered turn off IV insulin and consider checking  CBGs Q4 hours after delivery for Q12.  Thanks, 6/3 RN, MSN, BC-ADM Inpatient Diabetes Coordinator Team Pager [phone] (8a-5p)

## 2020-12-16 NOTE — Progress Notes (Addendum)
Labor Progress Note Suzanne James is a 25 y.o. G3P2002 at [redacted]w[redacted]d presented for PPROM.  S: Feeling well overall, not really feeling contractions   O:  BP 115/60   Pulse 75   Temp 98.7 F (37.1 C) (Oral)   Resp 18   Ht 5\' 3"  (1.6 m)   Wt 98.1 kg   LMP 04/14/2020   SpO2 100% Comment: room air  BMI 38.30 kg/m  EFM: 140/mod variability/pos accels/no decels   CVE: Dilation: 1 Effacement (%): 30 Cervical Position: Posterior Station: -3 Presentation: Vertex Exam by:: [email or web address], RN   A&P: 25 y.o. 22 [redacted]w[redacted]d admitted for Vaginal bleeding and PPROM, now on L&D for induction of labor.   #IOL PPROM: s/p Cytotec x2. Recheck in four hours. #PPROM: s/p latency abx. BMZ complete. S/p NICU consult.  #A2gdm: Previously on glyburide, elevated CBG for past few days, likely in setting of receiving BMZ. Continue endotool for now, consider transitioning off if stable. Most recent CBG 126 > 121 > 123.  #cHTN: Continue procardia 30. PEC labs stable.   #Pain: per patient request #FWB: cat I  #GBS pending, PCN     [redacted]w[redacted]d, MD 7:56 AM

## 2020-12-16 NOTE — Progress Notes (Signed)
Labor Progress Note Suzanne James is a 24 y.o. [id number] at [redacted]w[redacted]d presented for [facility] 12/14/20.   S: Doing well without complaints, feeling contractions.  O:  BP 133/80   Pulse 67   Temp 98.7 F (37.1 C) (Oral)   Resp 18   Ht 5\' 3"  (1.6 m)   Wt 98.1 kg   LMP 04/14/2020   SpO2 100% Comment: room air  BMI 38.30 kg/m  EFM: 150/mod variability/pos accels/no decels Toco: q1-80min, sometimes difficult to trace given patient ambulating  CVE: Dilation: 1 Effacement (%): Thick Cervical Position: Posterior Station: -2 Presentation: Vertex Exam by:: Dr. [email or web address] previously confirmed by BSUS 4/4 @1200    A&P: 25 y.o. [redacted]w[redacted]d admitted for Vaginal bleeding and PPROM 4/2, now on L&D for induction of labor.   #IOL PPROM: s/p Cytotec x3. Given cervical exam FB placed without difficulty and cytotec re-dosed, vaginally.  #PPROM: s/p latency abx. BMZ complete. S/p NICU consult.  #A2gdm: Previously on glyburide, elevated CBG for past few days, likely in setting of receiving BMZ. Continue endotool. Carb modified diet. #cHTN: Given no need for tocolysis will discontinue procardia and switch back to labetalol 100 mg BID (home regimen). Labetalol AM dose held given nml BP. Asymptomatic. No severe range BP. PreE labs nml.  #Anxiety: on zoloft 25 mg daily, ordered. Stable.  #Pain: per patient request #FWB: cat I  #GBS pending, PCN     [id number], MD 4:42 PM

## 2020-12-17 ENCOUNTER — Encounter: Payer: Self-pay | Admitting: *Deleted

## 2020-12-17 DIAGNOSIS — Z3A34 34 weeks gestation of pregnancy: Secondary | ICD-10-CM

## 2020-12-17 LAB — COMPREHENSIVE METABOLIC PANEL
ALT: 14 U/L (ref 0–44)
AST: 20 U/L (ref 15–41)
Albumin: 2.7 g/dL — ABNORMAL LOW (ref 3.5–5.0)
Alkaline Phosphatase: 97 U/L (ref 38–126)
Anion gap: 7 (ref 5–15)
BUN: 5 mg/dL — ABNORMAL LOW (ref 6–20)
CO2: 24 mmol/L (ref 22–32)
Calcium: 8.5 mg/dL — ABNORMAL LOW (ref 8.9–10.3)
Chloride: 106 mmol/L (ref 98–111)
Creatinine, Ser: 0.49 mg/dL (ref 0.44–1.00)
GFR, Estimated: >60 mL/min (ref >60)
Glucose, Bld: 99 mg/dL (ref 70–99)
Potassium: 2.9 mmol/L — ABNORMAL LOW (ref 3.5–5.1)
Sodium: 137 mmol/L (ref 135–145)
Total Bilirubin: 1 mg/dL (ref 0.3–1.2)
Total Protein: 5.4 g/dL — ABNORMAL LOW (ref 6.5–8.1)

## 2020-12-17 LAB — GLUCOSE, CAPILLARY
Glucose-Capillary: 82 mg/dL (ref 70–99)
Glucose-Capillary: 88 mg/dL (ref 70–99)
Glucose-Capillary: 86 mg/dL (ref 70–99)
Glucose-Capillary: 100 mg/dL — ABNORMAL HIGH (ref 70–99)
Glucose-Capillary: 100 mg/dL — ABNORMAL HIGH (ref 70–99)
Glucose-Capillary: 105 mg/dL — ABNORMAL HIGH (ref 70–99)
Glucose-Capillary: 105 mg/dL — ABNORMAL HIGH (ref 70–99)
Glucose-Capillary: 107 mg/dL — ABNORMAL HIGH (ref 70–99)
Glucose-Capillary: 134 mg/dL — ABNORMAL HIGH (ref 70–99)
Glucose-Capillary: 87 mg/dL (ref 70–99)
Glucose-Capillary: 89 mg/dL (ref 70–99)
Glucose-Capillary: 90 mg/dL (ref 70–99)
Glucose-Capillary: 92 mg/dL (ref 70–99)
Glucose-Capillary: 94 mg/dL (ref 70–99)
Glucose-Capillary: 94 mg/dL (ref 70–99)
Glucose-Capillary: 97 mg/dL (ref 70–99)
Glucose-Capillary: 97 mg/dL (ref 70–99)
Glucose-Capillary: 99 mg/dL (ref 70–99)
Glucose-Capillary: 88 mg/dL (ref 70–99)
Glucose-Capillary: 83 mg/dL (ref 70–99)
Glucose-Capillary: 79 mg/dL (ref 70–99)

## 2020-12-17 LAB — CBC
HCT: 30 % — ABNORMAL LOW (ref 36.0–46.0)
Hemoglobin: 9.9 g/dL — ABNORMAL LOW (ref 12.0–15.0)
MCH: 28.5 pg (ref 26.0–34.0)
MCHC: 33 g/dL (ref 30.0–36.0)
MCV: 86.5 fL (ref 80.0–100.0)
Platelets: 253 10*3/uL (ref 150–400)
RBC: 3.47 MIL/uL — ABNORMAL LOW (ref 3.87–5.11)
RDW: 13.6 % (ref 11.5–15.5)
WBC: 11 10*3/uL — ABNORMAL HIGH (ref 4.0–10.5)
nRBC: 0 % (ref 0.0–0.2)

## 2020-12-17 LAB — MAGNESIUM: Magnesium: 1.6 mg/dL — ABNORMAL LOW (ref 1.7–2.4)

## 2020-12-17 MED ORDER — SENNOSIDES-DOCUSATE SODIUM 8.6-50 MG PO TABS
2.0000 | ORAL_TABLET | Freq: Every day | ORAL | Status: DC
  Administered 2020-12-18: 2 via ORAL
  Filled 2020-12-17: qty 2

## 2020-12-17 MED ORDER — FENTANYL CITRATE (PF) 100 MCG/2ML IJ SOLN
25.0000 ug | INTRAMUSCULAR | Status: DC | PRN
  Administered 2020-12-17: 50 ug via INTRAVENOUS
  Administered 2020-12-17: 25 ug via INTRAVENOUS
  Administered 2020-12-17: 50 ug via INTRAVENOUS
  Filled 2020-12-17 (×2): qty 2

## 2020-12-17 MED ORDER — DIPHENHYDRAMINE HCL 25 MG PO CAPS
25.0000 mg | ORAL_CAPSULE | Freq: Four times a day (QID) | ORAL | Status: DC | PRN
Start: 2020-12-17 — End: 2020-12-19

## 2020-12-17 MED ORDER — PRENATAL MULTIVITAMIN CH
1.0000 | ORAL_TABLET | Freq: Every day | ORAL | Status: DC
  Administered 2020-12-18: 1 via ORAL
  Filled 2020-12-17: qty 1

## 2020-12-17 MED ORDER — SIMETHICONE 80 MG PO CHEW
80.0000 mg | CHEWABLE_TABLET | ORAL | Status: DC | PRN
Start: 2020-12-17 — End: 2020-12-19

## 2020-12-17 MED ORDER — DIBUCAINE (PERIANAL) 1 % EX OINT: 1.0000 "application " | TOPICAL_OINTMENT | CUTANEOUS | Status: DC | PRN

## 2020-12-17 MED ORDER — TETANUS-DIPHTH-ACELL PERTUSSIS 5-2.5-18.5 LF-MCG/0.5 IM SUSY: 0.5000 mL | PREFILLED_SYRINGE | Freq: Once | INTRAMUSCULAR | Status: DC

## 2020-12-17 MED ORDER — BENZOCAINE-MENTHOL 20-0.5 % EX AERO: 1.0000 "application " | INHALATION_SPRAY | CUTANEOUS | Status: DC | PRN

## 2020-12-17 MED ORDER — ZOLPIDEM TARTRATE 5 MG PO TABS: 5.0000 mg | ORAL_TABLET | Freq: Every evening | ORAL | Status: DC | PRN

## 2020-12-17 MED ORDER — BISACODYL 10 MG RE SUPP
10.0000 mg | Freq: Every day | RECTAL | Status: DC | PRN
  Filled 2020-12-17: qty 1

## 2020-12-17 MED ORDER — MISOPROSTOL 200 MCG PO TABS
400.0000 ug | ORAL_TABLET | Freq: Once | ORAL | Status: AC
  Administered 2020-12-17: 400 ug via RECTAL

## 2020-12-17 MED ORDER — IBUPROFEN 600 MG PO TABS
600.0000 mg | ORAL_TABLET | Freq: Four times a day (QID) | ORAL | Status: DC
  Administered 2020-12-18 – 2020-12-19 (×6): 600 mg via ORAL
  Filled 2020-12-17 (×6): qty 1

## 2020-12-17 MED ORDER — COCONUT OIL OIL: 1.0000 "application " | TOPICAL_OIL | Status: DC | PRN

## 2020-12-17 MED ORDER — MISOPROSTOL 200 MCG PO TABS
ORAL_TABLET | ORAL | Status: AC
  Filled 2020-12-17: qty 4

## 2020-12-17 MED ORDER — ACETAMINOPHEN 325 MG PO TABS: 650.0000 mg | ORAL_TABLET | ORAL | Status: DC | PRN

## 2020-12-17 MED ORDER — MISOPROSTOL 200 MCG PO TABS
400.0000 ug | ORAL_TABLET | Freq: Once | ORAL | Status: AC
  Administered 2020-12-17: 400 ug via BUCCAL

## 2020-12-17 MED ORDER — SODIUM CHLORIDE 0.9% FLUSH: 3.0000 mL | INTRAVENOUS | Status: DC | PRN

## 2020-12-17 MED ORDER — ONDANSETRON HCL 4 MG/2ML IJ SOLN: 4.0000 mg | INTRAMUSCULAR | Status: DC | PRN

## 2020-12-17 MED ORDER — FLEET ENEMA 7-19 GM/118ML RE ENEM: 1.0000 | ENEMA | Freq: Every day | RECTAL | Status: DC | PRN

## 2020-12-17 MED ORDER — SODIUM CHLORIDE 0.9% FLUSH
3.0000 mL | Freq: Two times a day (BID) | INTRAVENOUS | Status: DC
  Administered 2020-12-18 (×2): 3 mL via INTRAVENOUS

## 2020-12-17 MED ORDER — TRANEXAMIC ACID-NACL 1000-0.7 MG/100ML-% IV SOLN
1000.0000 mg | INTRAVENOUS | Status: AC
  Administered 2020-12-17: 1000 mg via INTRAVENOUS

## 2020-12-17 MED ORDER — WITCH HAZEL-GLYCERIN EX PADS: 1.0000 "application " | MEDICATED_PAD | CUTANEOUS | Status: DC | PRN

## 2020-12-17 MED ORDER — MEASLES, MUMPS & RUBELLA VAC IJ SOLR: 0.5000 mL | Freq: Once | INTRAMUSCULAR | Status: DC

## 2020-12-17 MED ORDER — SODIUM CHLORIDE 0.9 % IV SOLN: 250.0000 mL | INTRAVENOUS | Status: DC | PRN

## 2020-12-17 MED ORDER — TRANEXAMIC ACID-NACL 1000-0.7 MG/100ML-% IV SOLN
INTRAVENOUS | Status: AC
  Filled 2020-12-17: qty 100

## 2020-12-17 MED ORDER — ONDANSETRON HCL 4 MG PO TABS: 4.0000 mg | ORAL_TABLET | ORAL | Status: DC | PRN

## 2020-12-17 NOTE — Progress Notes (Signed)
Patient Vitals for the past 4 hrs:  BP Temp Temp src Pulse Resp  12/17/20 0758 123/75 98.2 F (36.8 C) Oral 67 (!) 25  12/17/20 0523 132/69 98.1 F (36.7 C) Oral 60 18   Received cytotec at 0500 d/t cx still being quite thick.  FHR Cat 1, mild irregular ctx.  CBG 97/9082. On Endotool.Marland Kitchen Hopefully plan Pitocin soon.

## 2020-12-17 NOTE — Progress Notes (Addendum)
Suzanne James at bedside. We were unable to get a continuous FHR during contractions while pt was involuntary pushing. CMW attempted to place [facility] while pt was hands and knees @ 2145. We attempted to readjust pt to her side to ensure that the [facility] was placed correctly. When those attempts were unsuccesful we tried to find baby FHR with external monitor while she pushed continuouslty until delivery on her right side .

## 2020-12-17 NOTE — Telephone Encounter (Signed)
Left message @ 10:51 am. I will send a MyChart message. JSY

## 2020-12-17 NOTE — Progress Notes (Signed)
Patient ID: Suzanne James, female   DOB: 12/03/1995, 25 y.o.   MRN: [id number] Suzanne James is a 25 y.o. G3P2002 at [redacted]w[redacted]d admitted for induction of labor due to PPROM  Subjective: denies headache, visual changes, ruq/epigastric pain, n/v and no pain w/ uc's  Objective: BP (!) 142/79   Pulse 60   Temp 98.3 F (36.8 C) (Oral)   Resp 20   Ht 5\' 3"  (1.6 m)   Wt 98.1 kg   LMP 04/14/2020   SpO2 98%   BMI 38.30 kg/m  No intake/output data recorded.  FHT:  FHR: 150 bpm, variability: moderate,  accelerations:  Present,  decelerations:  Absent UC:   regular, every 2 minutes   Adequate MVUs  SVE:  4/th/-3, vtx AROM forebag w/ large amt clear fluid  Pitocin @ 14 mu/min  Labs: Lab Results  Component Value Date   WBC 11.0 (H) 12/17/2020   HGB 9.9 (L) 12/17/2020   HCT 30.0 (L) 12/17/2020   MCV 86.5 12/17/2020   PLT 253 12/17/2020    Assessment / Plan: IOL d/t PPROM on 4/2, s/p 8 cytotec, FB out 4/5 PM, Pit started @ 1320 today, now @ 74mu/min, IUPC placed @ 1800-adequate MVUs, had forebag-now ruptured. S/P BMZ x2. Had been on endotool d/t hyperglycemia r/t steroids  Labor: early Fetal Wellbeing:  Category I Pain Control:  Labor support without medications Pre-eclampsia: bp's stable and N/A I/D:  PCN for GBS pending/preterm Anticipated MOD: NSVB  13m CNM, WHNP-BC 12/17/2020, 8:34 PM

## 2020-12-17 NOTE — Discharge Summary (Signed)
Postpartum Discharge Summary  Date of Service updated 12/19/20     Patient Name: Suzanne James DOB: 1996-07-19 MRN: [id number]  Date of admission: 12/13/2020 Delivery date:12/17/2020  Delivering provider: Wells Guiles R  Date of discharge: 12/19/2020  Admitting diagnosis: Vaginal bleeding in pregnancy, third trimester [O46.93] History of preterm premature rupture of membranes [Z87.59] Intrauterine pregnancy: [redacted]w[redacted]d    Secondary diagnosis:  Active Problems: GDMA2 Gestational HTN Anxiety Postpartum hemorrhage Hypokalemia  Additional problems: none    Discharge diagnosis: Preterm Pregnancy Delivered, CHTN, GDM A2 and PPH  Hypokalemia                                            Post partum procedures:none Augmentation: AROM, Pitocin, Cytotec and IP Foley Complications: [id number](given cytotec and TXA)  Hospital course: Induction of Labor With Vaginal Delivery   25y.o. yo [id number] admitted to the hospital 12/13/2020 for induction of labor.  Indication for induction: PPROM.  Patient had an uncomplicated labor course as follows: Membrane Rupture Time/Date: 12:38 PM ,12/14/2020   Delivery Method:Vaginal, Spontaneous  Episiotomy: None  Lacerations:  None  Details of delivery can be found in separate delivery note.  Delivery was complicated by PPH due to uterine atony- she required cytotec and TXA. Total EBL 700cc.  Patient had a routine postpartum course. Her blood pressure was stable off medication and accuchecks were within normal range.  Patient is discharged home 12/19/20.  Newborn Data: Birth date:12/17/2020  Birth time:9:51 PM  Gender:Female  Living status:Living  Apgars:8 ,9  Weight:2050 g   Magnesium Sulfate received: No BMZ received: Yes Rhophylac:N/A MMR:N/A T-DaP:Given prenatally Flu: given prenatally Transfusion:No  Physical exam  Vitals:   12/18/20 1844 12/18/20 1919 12/19/20 0428 12/19/20 0811  BP: 129/81 (!) 141/90 121/65 137/81  Pulse: 69 70  61 (!) 57  Resp: _0 Temp: 97.7 F (36.5 C) 97.8 F (36.6 C) 97.9 F (36.6 C) 97.8 F (36.6 C)  TempSrc: Oral Oral Oral Oral  SpO2: 96% 100% 100% 97%  Weight:      Height:       General: alert, cooperative and no distress Lochia: appropriate Uterine Fundus: firm Incision: N/A DVT Evaluation: No evidence of DVT seen on physical exam. Labs: Lab Results  Component Value Date   WBC 15.8 (H) 12/18/2020   HGB 9.6 (L) 12/18/2020   HCT 28.4 (L) 12/18/2020   MCV 84.3 12/18/2020   PLT 268 12/18/2020   CMP Latest Ref Rng & Units 12/19/2020  Glucose 70 - 99 mg/dL 140(H)  BUN 6 - 20 mg/dL 6  Creatinine 0.44 - 1.00 mg/dL 0.67  Sodium 135 - 145 mmol/L 137  Potassium 3.5 - 5.1 mmol/L 2.9(L)  Chloride 98 - 111 mmol/L 106  CO2 22 - 32 mmol/L 23  Calcium 8.9 - 10.3 mg/dL 8.7(L)  Total Protein 6.5 - 8.1 g/dL -  Total Bilirubin 0.3 - 1.2 mg/dL -  Alkaline Phos 38 - 126 U/L -  AST 15 - 41 U/L -  ALT 0 - 44 U/L -   Edinburgh Score: No flowsheet data found.   After visit meds:  Allergies as of 12/19/2020   No Known Allergies     Medication List    STOP taking these medications   aspirin EC 81 MG tablet   glyBURIDE 5 MG tablet Commonly  known as: DIABETA   labetalol 100 MG tablet Commonly known as: NORMODYNE     TAKE these medications   Accu-Chek Guide test strip Generic drug: glucose blood QS for QID testing   Accu-Chek Softclix Lancets lancets Use as instructed to check blood sugar 4 times daily   acetaminophen 325 MG tablet Commonly known as: TYLENOL Take 650 mg by mouth every 6 (six) hours as needed for mild pain or headache.   ferrous sulfate 325 (65 FE) MG tablet Take 1 tablet (325 mg total) by mouth every other day.   ibuprofen 600 MG tablet Commonly known as: ADVIL Take 1 tablet (600 mg total) by mouth every 6 (six) hours as needed.   potassium chloride 10 MEQ tablet Commonly known as: KLOR-CON Take 4 tablets (40 mEq total) by mouth daily for 3  days.   prenatal multivitamin Tabs tablet Take 1 tablet by mouth daily at 12 noon.   sertraline 25 MG tablet Commonly known as: ZOLOFT TAKE 1 TABLET BY MOUTH EVERY DAY        Discharge home in stable condition Infant Feeding: Breast Infant Disposition:NICU Discharge instruction: per After Visit Summary and Postpartum booklet. Activity: Advance as tolerated. Pelvic rest for 6 weeks.  Diet: carb modified diet Future Appointments: Future Appointments  Date Time Provider [place]  12/26/2020 10:10 AM [facility] NURSE [facility]  01/28/2021 11:50 AM Roma Schanz, CNM [facility]   Follow up Visit:  Follow-up Information    [facility] Follow up in 1 week(s).   Specialty: Obstetrics and Gynecology Contact information: [address] [place], [place], [place]  Gloris Manchester Please schedule this patient for PP visit in: bp check 1wk, At 1wk visit- please repeat BMP to check potassium level and pp visit 4-6wks  High risk pregnancy complicated by: CHTN, GDM  Delivery mode: SVD  Anticipated Birth Control: POPs  PP Procedures needed: BP check  Schedule Integrated BH visit: no  Provider: Any provider    12/19/2020 Annalee Genta, DO

## 2020-12-17 NOTE — Progress Notes (Signed)
Patient Vitals for the past 4 hrs:  BP Temp Temp src Pulse Resp  12/16/20 2343 125/68 97.7 F (36.5 C) Oral 75 18   FHR Cat 1. Mild ctx q 4-5 minutes.  Cx 4.5/20/-3.  Will give one more cytotec, plan to start pitocin in 4 hours.

## 2020-12-17 NOTE — Progress Notes (Addendum)
Labor Progress Note Suzanne James is a 25 y.o. G3P2002 at [redacted]w[redacted]d presented for recurrent vaginal bleeding and PPROM, now on L&D for induction of labor.  S: Endorses significant pain.  O:  BP 126/70   Pulse 62   Temp 98.1 F (36.7 C) (Oral)   Resp (!) 24   Ht 5\' 3"  (1.6 m)   Wt 98.1 kg   LMP 04/14/2020   SpO2 100% Comment: room air  BMI 38.30 kg/m  EFM: Baseline 145 /Modearate Variability/ + Accelerations, no Decelerations Contractions every 3 minutes  CVE: Dilation: 4.5 Effacement (%): Thick Cervical Position: Posterior Station: -2 Presentation: Vertex Exam by:: Dr. [email or web address]   A&P: 25 y.o. 22 [redacted]w[redacted]d presented for recurrent vaginal bleeding and PPROM 4/2, now on L&D for induction of labor. #IOL: S/p 7 cytotec. FB out at 2015. Given thick efacement gave eighth dose of cytotec. Start pit at next check. #Pain: Per patient request #FWB: Category 1 #GBS culture pending, has received adequate treatment with Penicillin #A2gdm: EFW 1968  grams, 52 % @32w . Previously on glyburide outpatient, elevated CBG for past few days, likely in setting of receiving BMZ. Continue endotool for now, consider transitioning off if stable. Last 3 CBG's < 100. #cHTN:  Labetalol 100 mg BID outpatient, transitioned to Procardia on admit due to tocolysis.  Now on Labetalol 100 mg BID.  SBP's- 120's-130's.  Asymptomatic. No severe range BP. PreE labs nml. #Depression/Anxiety:  Zoloft 25 mg qd  [redacted]w[redacted]d, MD 10:15 AM

## 2020-12-17 NOTE — Progress Notes (Signed)
Called to bedside by RN, patient complaining of chest pressure. Upon evaluation patient endorses 4/10 left sided chest pressure, not sharp, non-radiating. No associated shortness of breath, cough, congestion. No fever/chills. No headaches vision changes. No history of similar symptoms. No palpitations. Not related to movement. VSS. Exam reveals RRR, no MRG, lungs CTAB, normal respiratory effort. Suspect most likely musculoskeletal in nature given reproducible with palpation on exam. Cardiopulmonary exam reassuring and VSS, low suspicion for PE at this time. Low suspicion for ACS or arrhythmia but will obtain EKG, CMP, Mg. Discussed plan with patient and RNs, ammendable to plan. Patient is also now on a clear liquid diet so will transition off endotool and monitor q2 CBG.  Suzanne Seton, MD OB Fellow, Faculty [facility], Center for [facility] 12/17/2020 3:51 PM

## 2020-12-17 NOTE — Progress Notes (Addendum)
Labor Progress Note Suzanne James is a 25 y.o. G3P2002 at [redacted]w[redacted]d presented for recurrent vaginal bleeding andPPROM 4/2, now on L&D for induction of labor. S: Pain has improved.  Can feel contractions and chest pain now gone.  O:  BP 132/73   Pulse 62   Temp 98.6 F (37 C) (Oral)   Resp (!) 22   Ht 5\' 3"  (1.6 m)   Wt 98.1 kg   LMP 04/14/2020   SpO2 98%   BMI 38.30 kg/m  EFM: Baseline 150/Modearate Variability/+ Accelerations,noDecelerations Contractions every 3 minutes  CVE: Dilation: 5 Effacement (%): 30 Cervical Position: Posterior Station: -2 Presentation: Vertex Exam by:: Dr. [email or web address]   A&P: 25 y.o. 22 [redacted]w[redacted]d presented for recurrent vaginal bleeding andPPROM 4/2, now on L&D for induction of labor. #Labor: S/p 8 cytotec. FB out at 2015 4/4.Pit started at 1322. Cervix unchanged since previous exam.Placed IUPC at this check. #Pain:Per patient request #FWB:Category 1 #GBSculture pending, has received adequate treatment with Penicillin #A2gdm:EFW1968 grams, 52 %@32w .Previously on glyburideoutpatient, elevated CBG for past few days, likely in setting of receiving BMZ. Endotool discontinued given BGL wnl. Continue to monitor q2 CBG. #cHTN:Labetalol 100 mg BID outpatient, transitioned toProcardiaon admit due to tocolysis. Now on Labetalol 100 mg BID. SBP's- 120's-130's. Asymptomatic. No severe range BP. PreE labs nml. #Muskloskeletal chest pain: now resolved, EKG unremarkable, Mg and K low will replete postpartum.  6/4, MD 6:06 PM  GME ATTESTATION:  I saw and evaluated the patient. I agree with the findings and the plan of care as documented in the resident's note.  Jovita Kussmaul, MD OB Fellow, Faculty [facility], Center for [facility] 12/17/2020 6:36 PM

## 2020-12-17 NOTE — Plan of Care (Signed)
  Problem: Education: Goal: Knowledge of General Education information will improve Description: Including pain rating scale, medication(s)/side effects and non-pharmacologic comfort measures Outcome: Progressing   Problem: Health Behavior/Discharge Planning: Goal: Ability to manage health-related needs will improve Outcome: Progressing   Problem: Clinical Measurements: Goal: Ability to maintain clinical measurements within normal limits will improve Outcome: Progressing Goal: Will remain free from infection Outcome: Progressing Goal: Diagnostic test results will improve Outcome: Progressing Goal: Respiratory complications will improve Outcome: Progressing Goal: Cardiovascular complication will be avoided Outcome: Progressing   Problem: Activity: Goal: Risk for activity intolerance will decrease Outcome: Progressing   Problem: Nutrition: Goal: Adequate nutrition will be maintained Outcome: Progressing   Problem: Coping: Goal: Level of anxiety will decrease Outcome: Progressing   Problem: Elimination: Goal: Will not experience complications related to bowel motility Outcome: Progressing Goal: Will not experience complications related to urinary retention Outcome: Progressing   Problem: Pain Managment: Goal: General experience of comfort will improve Outcome: Progressing   Problem: Safety: Goal: Ability to remain free from injury will improve Outcome: Progressing   Problem: Skin Integrity: Goal: Risk for impaired skin integrity will decrease Outcome: Progressing   Problem: Education: Goal: Knowledge of disease or condition will improve Outcome: Progressing Goal: Knowledge of the prescribed therapeutic regimen will improve Outcome: Progressing Goal: Individualized Educational Video(s) Outcome: Progressing   Problem: Clinical Measurements: Goal: Complications related to the disease process, condition or treatment will be avoided or minimized Outcome:  Progressing   Problem: Education: Goal: Knowledge of Childbirth will improve Outcome: Progressing Goal: Ability to make informed decisions regarding treatment and plan of care will improve Outcome: Progressing Goal: Ability to state and carry out methods to decrease the pain will improve Outcome: Progressing Goal: Individualized Educational Video(s) Outcome: Progressing   Problem: Coping: Goal: Ability to verbalize concerns and feelings about labor and delivery will improve Outcome: Progressing   Problem: Life Cycle: Goal: Ability to make normal progression through stages of labor will improve Outcome: Progressing Goal: Ability to effectively push during vaginal delivery will improve Outcome: Progressing   Problem: Role Relationship: Goal: Will demonstrate positive interactions with the child Outcome: Progressing   Problem: Safety: Goal: Risk of complications during labor and delivery will decrease Outcome: Progressing   Problem: Pain Management: Goal: Relief or control of pain from uterine contractions will improve Outcome: Progressing   

## 2020-12-17 NOTE — Progress Notes (Addendum)
Labor Progress Note Suzanne James is a 25 y.o. G3P2002 at [redacted]w[redacted]d presented for recurrent vaginal bleeding and PPROM 4/2, now on L&D for induction of labor.  S: Still endorses pain.    O:  BP 133/71   Pulse 64   Temp 98.1 F (36.7 C) (Oral)   Resp (!) 22   Ht 5\' 3"  (1.6 m)   Wt 98.1 kg   LMP 04/14/2020   SpO2 100% Comment: room air  BMI 38.30 kg/m  EFM: Baseline 145 /Modearate Variability/ + Accelerations, no Decelerations Contractions every 3 minutes  CVE: Dilation: 5 Effacement (%): 30 Cervical Position: Posterior Station: -2 Presentation: Vertex Exam by:: Dr. [email or web address]   Suzanne James: 25 y.o. 22 [redacted]w[redacted]d presented for recurrent vaginal bleeding and PPROM 4/2, now on L&D for induction of labor. #IOL: S/p 8 cytotec. FB out at 2015. Starting Pitocin. #Pain: Per patient request #FWB: Category 1 #GBS culture pending, has received adequate treatment with Penicillin #A2gdm: EFW 1968 grams, 52 % @32w . Previously on glyburide outpatient, elevated CBG for past few days, likely in setting of receiving BMZ. Continue endotool for now, consider transitioning off if stable. Last 3 CBG's < 100. #cHTN:  Labetalol 100 mg BID outpatient, transitioned to Procardia on admit due to tocolysis.  Now on Labetalol 100 mg BID.  SBP's- 120's-130's.  Asymptomatic. No severe range BP. PreE labs nml.   [redacted]w[redacted]d, MD 1:12 PM   GME ATTESTATION:  I saw and evaluated the patient. I agree with the findings and the plan of care as documented in the resident's note.  , MD OB Fellow, Faculty [facility], Center for [facility] 12/17/2020 1:30 PM

## 2020-12-18 LAB — CBC
HCT: 28.4 % — ABNORMAL LOW (ref 36.0–46.0)
Hemoglobin: 9.6 g/dL — ABNORMAL LOW (ref 12.0–15.0)
MCH: 28.5 pg (ref 26.0–34.0)
MCHC: 33.8 g/dL (ref 30.0–36.0)
MCV: 84.3 fL (ref 80.0–100.0)
Platelets: 268 10*3/uL (ref 150–400)
RBC: 3.37 MIL/uL — ABNORMAL LOW (ref 3.87–5.11)
RDW: 13.2 % (ref 11.5–15.5)
WBC: 15.8 10*3/uL — ABNORMAL HIGH (ref 4.0–10.5)
nRBC: 0 % (ref 0.0–0.2)

## 2020-12-18 LAB — CULTURE, BETA STREP (GROUP B ONLY)

## 2020-12-18 LAB — COMPREHENSIVE METABOLIC PANEL
ALT: 16 U/L (ref 0–44)
AST: 21 U/L (ref 15–41)
Albumin: 2.4 g/dL — ABNORMAL LOW (ref 3.5–5.0)
Alkaline Phosphatase: 88 U/L (ref 38–126)
Anion gap: 8 (ref 5–15)
BUN: <5 mg/dL — ABNORMAL LOW (ref 6–20)
CO2: 25 mmol/L (ref 22–32)
Calcium: 8.5 mg/dL — ABNORMAL LOW (ref 8.9–10.3)
Chloride: 104 mmol/L (ref 98–111)
Creatinine, Ser: 0.52 mg/dL (ref 0.44–1.00)
GFR, Estimated: >60 mL/min (ref >60)
Glucose, Bld: 97 mg/dL (ref 70–99)
Potassium: 2.7 mmol/L — CL (ref 3.5–5.1)
Sodium: 137 mmol/L (ref 135–145)
Total Bilirubin: 1.1 mg/dL (ref 0.3–1.2)
Total Protein: 5 g/dL — ABNORMAL LOW (ref 6.5–8.1)

## 2020-12-18 LAB — GLUCOSE, CAPILLARY: Glucose-Capillary: 102 mg/dL — ABNORMAL HIGH (ref 70–99)

## 2020-12-18 MED ORDER — POTASSIUM CHLORIDE 10 MEQ/100ML IV SOLN
10.0000 meq | INTRAVENOUS | Status: AC
  Administered 2020-12-18 (×3): 10 meq via INTRAVENOUS
  Filled 2020-12-18 (×3): qty 100

## 2020-12-18 MED ORDER — SERTRALINE HCL 25 MG PO TABS
25.0000 mg | ORAL_TABLET | Freq: Every day | ORAL | Status: DC
  Administered 2020-12-18: 25 mg via ORAL
  Filled 2020-12-18 (×2): qty 1

## 2020-12-18 MED ORDER — FERROUS SULFATE 325 (65 FE) MG PO TABS
325.0000 mg | ORAL_TABLET | ORAL | Status: DC
  Administered 2020-12-18: 325 mg via ORAL
  Filled 2020-12-18: qty 1

## 2020-12-18 NOTE — Clinical Social Work Maternal (Signed)
CLINICAL SOCIAL WORK MATERNAL/CHILD NOTE  Patient Details  Name: Suzanne James MRN: [id number] Date of Birth: 07/21/1996  Date:  12/18/2020  Clinical Social Worker Initiating Note:  Laurey Arrow Date/Time: Initiated:  12/18/20/1432     Child's Name:  Suzanne James   Biological Parents:  Mother,Father   Need for Interpreter:  None   Reason for Referral:  Behavioral Health Concerns (hx of anxiety)   Address:  [place] [address]    Phone number:  [phone] (home)     Additional phone number: FOB's number [phone]  Household Members/Support Persons (HM/SP):   Household Member/Support Person 1,Household Member/Support Person 2,Household Member/Support Person 3   HM/SP Name Relationship DOB or Age  HM/SP -1 Arriel Victor FOB/Husband 03/31/1992  HM/SP -2 Edwina Barth daughter 05/20/15  HM/SP -3 Mercia Dowe daughter 09/17/2017  HM/SP -4        HM/SP -5        HM/SP -6        HM/SP -7        HM/SP -8          Natural Supports (not living in the home):  Extended Family,Immediate Family   Professional Supports: None   Employment: Unemployed   Type of Work:     Education:  Nurse, adult   Homebound arranged:    Museum/gallery curator Resources:  [employer]   Other Resources:   (CSW gave MOB information to apply for [employer].  MOB reported that she has a pending application for Food Stamps.)   Cultural/Religious Considerations Which May Impact Care:  None reported  Strengths:  Ability to meet basic needs ,Home prepared for child ,Psychotropic Medications,Pediatrician chosen   Psychotropic Medications:  Zoloft      Pediatrician:    [facility]  Pediatrician List:   [place]      Pediatrician Fax Number:    Risk Factors/Current Problems:  Mental Health Concerns    Cognitive State:  Linear  Thinking ,Insightful ,Goal Oriented ,Able to Concentrate ,Alert    Mood/Affect:  Interested ,Comfortable ,Happy ,Relaxed ,Overwhelmed ,Bright ,Calm    CSW Assessment: CSW met with MOB in room 104 to complete an assessment for MH hx. When CSW arrived, MOB was resting in bed; MOB appeared comfortable. CSW explained CSW's role and MOB was receptive to meeting with CSW.  MOB was polite, forthcoming, and easy to engage.   CSW asked about MOB's MH hx and MOB acknowledged a hx of anxiety.  MOB reported that she was dx in 2020 and has been regulated on Zoloft. Per MOB, her medication has helped with decreasing her symptoms.   CSW provided education regarding the baby blues period vs. perinatal mood disorders, discussed treatment and gave resources for mental health follow up if concerns arise.  CSW recommends self-evaluation during the postpartum time period using the New Mom Checklist from Postpartum Progress and encouraged MOB to contact a medical professional if symptoms are noted at any time. MOB presented with insight and awareness and she did not demonstrate any acute MH symptoms. MOB reported having a good support team and expressed feeling comfortable seeking help if needed. CSW assessed for safety and MOB denied SI, HI, an DV.   MOB reported feeling attached and bonded to infant and shared having all essential items to care for infant post discharge.   CSW provided  review of Sudden Infant Death Syndrome (SIDS) precautions.    CSW will continue to offer resources and supports to family while infant remains in NICU.    CSW Plan/Description:  Psychosocial Support and Ongoing Assessment of Needs,Sudden Infant Death Syndrome (SIDS) Education,Perinatal Mood and Anxiety Disorder (PMADs) Education,Other Patient/Family Education,Other Information/Referral to [employer], MSW, [employer] Social Work [phone]   Dimple Nanas, LCSW 12/18/2020, 3:03  PM

## 2020-12-18 NOTE — Lactation Note (Addendum)
This note was copied from a baby's chart. Lactation Consultation Note  Patient Name: Suzanne James HUTML'Y Date: 12/18/2020 Reason for consult: Initial assessment;NICU baby;Late-preterm 34-36.6wks;Infant < 6lbs Age:26 hours  P3, Mother is Ex BF and has pumped volumes of 20 & 30 ml.  Reviewed hand expression with good flow. Mother states she has already latched baby.  Suggest calling when further assistance with latching is needed.  Reviewed pumping q 2-3 hours during the day & q 3-4 hours during the night with a minimum of 8 times per day. 24 mm flanges seems a good fit and comfortable but mother knows to increase size to 27 if nipples rub. Mother knows she can pump at infant's bedside. Mother has personal DEBP at home. Provided lactation brochure, NICU booklet and labels.  Briefly discussed IDF.   Mom made aware of O/P services, breastfeeding support groups, and our phone # for post-discharge questions.   Maternal Data Has patient been taught Hand Expression?: Yes Does the patient have breastfeeding experience prior to this delivery?: Yes How long did the patient breastfeed?:  (2.5 years with last child)  Feeding Mother's Current Feeding Choice: Breast Milk and Donor Milk  Lactation Tools Discussed/Used Tools: Pump Breast pump type: Double-Electric Breast Pump Pump Education: Setup, frequency, and cleaning;Milk Storage Reason for Pumping: maternal separation from infant/NICU Pumping frequency: q 3 hours Pumped volume: 30 mL  Interventions Interventions: DEBP;Hand express;Education  Discharge Pump: DEBP  Consult Status Consult Status: Follow-up Date: 12/18/20 Follow-up type: In-patient    Dahlia Byes [facility] 12/18/2020, 9:42 AM

## 2020-12-18 NOTE — Progress Notes (Signed)
POSTPARTUM PROGRESS NOTE  PPD #1  Subjective:  Kaysha Parsell is a 25 y.o. [id number] s/p NSVD at [redacted]w[redacted]d. Today she notes no acute complaints. She denies any problems with ambulating, voiding or po intake. Denies nausea or vomiting. She has + flatus, +BM.  Pain is well controlled.  Noted two lemon-size clots today, but since bleeding has improved and is appropriate.  Pt up visiting baby in NICU Denies fever/chills/chest pain/SOB.  no HA, no blurry vision, noRUQ pain  Objective: Blood pressure 118/66, pulse 64, temperature 98.4 F (36.9 C), temperature source Oral, resp. rate 16, height 5\' 3"  (1.6 m), weight 98.1 kg, last menstrual period 04/14/2020, SpO2 98 %.  Physical Exam:  General: alert, cooperative and no distress Chest: no respiratory distress Abdomen: soft, nontender Uterine Fundus: firm, appropriately tender DVT Evaluation: No calf swelling or tenderness Extremities: no edema Skin: warm, dry  Results for orders placed or performed during the hospital encounter of 12/13/20 (from the past 24 hour(s))  Glucose, capillary     Status: None   Collection Time: 12/17/20  5:07 PM  Result Value Ref Range   Glucose-Capillary 83 70 - 99 mg/dL  Glucose, capillary     Status: None   Collection Time: 12/17/20  6:39 PM  Result Value Ref Range   Glucose-Capillary 79 70 - 99 mg/dL  CBC     Status: Abnormal   Collection Time: 12/18/20  4:42 AM  Result Value Ref Range   WBC 15.8 (H) 4.0 - 10.5 K/uL   RBC 3.37 (L) 3.87 - 5.11 MIL/uL   Hemoglobin 9.6 (L) 12.0 - 15.0 g/dL   HCT 02/17/21 (L) 48.0 - 16.5 %   MCV 84.3 80.0 - 100.0 fL   MCH 28.5 26.0 - 34.0 pg   MCHC 33.8 30.0 - 36.0 g/dL   RDW 53.7 48.2 - 70.7 %   Platelets 268 150 - 400 K/uL   nRBC 0.0 0.0 - 0.2 %  Comprehensive metabolic panel     Status: Abnormal   Collection Time: 12/18/20  4:42 AM  Result Value Ref Range   Sodium 137 135 - 145 mmol/L   Potassium 2.7 (LL) 3.5 - 5.1 mmol/L   Chloride 104 98 - 111 mmol/L   CO2 25 22 - 32  mmol/L   Glucose, Bld 97 70 - 99 mg/dL   BUN <5 (L) 6 - 20 mg/dL   Creatinine, Ser 02/17/21 0.44 - 1.00 mg/dL   Calcium 8.5 (L) 8.9 - 10.3 mg/dL   Total Protein 5.0 (L) 6.5 - 8.1 g/dL   Albumin 2.4 (L) 3.5 - 5.0 g/dL   AST 21 15 - 41 U/L   ALT 16 0 - 44 U/L   Alkaline Phosphatase 88 38 - 126 U/L   Total Bilirubin 1.1 0.3 - 1.2 mg/dL   GFR, Estimated 5.44 >92 mL/min   Anion gap 8 5 - 15  Glucose, capillary     Status: Abnormal   Collection Time: 12/18/20  5:14 AM  Result Value Ref Range   Glucose-Capillary 102 (H) 70 - 99 mg/dL    Assessment/Plan: Markee Remlinger is a 25 y.o. 22 s/p NSVD at [redacted]w[redacted]d PPD#1 complicated by: 1) PPH- EBL700cc, s/p cytotec and TXA, bleeding now appropriate VSS, pt asymptomatic CBC as above, currently on iron therapy  2) cHTN -no meds currently -BP within normal limits  3) GDMA2 -repeat accucheck tomorrow morning due to late delivery  -routine postpartum care -continue zoloft 25mg  daily  Contraception: POPs Feeding: in NICU- breastfeeding  Dispo: Plan for discharge home tomorrow   LOS: 3 days   Myna Hidalgo, DO Faculty Attending, Center for [facility] 12/18/2020, 4:10 PM

## 2020-12-18 NOTE — Discharge Instructions (Signed)

## 2020-12-18 NOTE — Lactation Note (Signed)
This note was copied from a baby's chart. Lactation Consultation Note  Patient Name: Suzanne James ZRAQT'M Date: 12/18/2020   Age:26 hours  Previous LC note states that mom is experienced bf'er and has bf baby today. Per RN, baby is no longer NPO and mom ok to bf today. However, mom will need reminder to pump her breasts prior to bf'ing on or about day 3 when milk is. She will need to continue pre-pumping until SLP has evaluated and determined she is ok to bf with full breast.    Elder Negus, MA IBCLC 12/18/2020, 2:13 PM

## 2020-12-19 ENCOUNTER — Other Ambulatory Visit: Payer: BC Managed Care – PPO

## 2020-12-19 LAB — BASIC METABOLIC PANEL
Anion gap: 8 (ref 5–15)
BUN: 6 mg/dL (ref 6–20)
CO2: 23 mmol/L (ref 22–32)
Calcium: 8.7 mg/dL — ABNORMAL LOW (ref 8.9–10.3)
Chloride: 106 mmol/L (ref 98–111)
Creatinine, Ser: 0.67 mg/dL (ref 0.44–1.00)
GFR, Estimated: >60 mL/min (ref >60)
Glucose, Bld: 140 mg/dL — ABNORMAL HIGH (ref 70–99)
Potassium: 2.9 mmol/L — ABNORMAL LOW (ref 3.5–5.1)
Sodium: 137 mmol/L (ref 135–145)

## 2020-12-19 LAB — GLUCOSE, CAPILLARY: Glucose-Capillary: 91 mg/dL (ref 70–99)

## 2020-12-19 MED ORDER — POTASSIUM CHLORIDE ER 10 MEQ PO TBCR: 40.0000 meq | EXTENDED_RELEASE_TABLET | Freq: Every day | ORAL | 0 refills | Status: DC

## 2020-12-19 MED ORDER — IBUPROFEN 600 MG PO TABS: 600.0000 mg | ORAL_TABLET | Freq: Four times a day (QID) | ORAL | 0 refills | Status: DC | PRN

## 2020-12-19 NOTE — Lactation Note (Signed)
This note was copied from a baby's chart. Lactation Consultation Note  Patient Name: Suzanne James [id number] Date: 12/19/2020 Reason for consult: Follow-up assessment;Infant < 6lbs;Late-preterm 34-36.6wks;NICU baby Age:26 hours   LC in to visit with P3 Mom of LPTI in the NICU.  Mom to  Be discharged today and plans to stay in baby's room.    Baby has been latching to the breast "really well" per Mom.  Mom requested a lactation consult tomorrow.   Appt made for 11 am feeding.    Baby is being gavage fed EBM and donor breast milk.   Encouraged STS and offering breast with cues.  Encouraged continued consistent pumping and advised to use maintenance setting on pump once volume has exceeded 20 ml times 3 pumpings.  Engorgement prevention and treatment reviewed.  Mom aware of lactation support available to her.   Lactation Tools Discussed/Used Tools: Pump Breast pump type: Double-Electric Breast Pump Pumping frequency: Q3 Pumped volume: 15 mL  Interventions Interventions: Education;Skin to skin;Breast massage;Hand express;DEBP  Discharge Discharge Education: Engorgement and breast care Pump: Personal (Spectra 2)  Consult Status Consult Status: Follow-up Date: 12/20/20 Follow-up type: In-patient    Judee Clara 12/19/2020, 8:53 AM

## 2020-12-20 ENCOUNTER — Ambulatory Visit: Payer: Self-pay

## 2020-12-20 LAB — SURGICAL PATHOLOGY

## 2020-12-20 NOTE — Lactation Note (Signed)
This note was copied from a baby's chart. Lactation Consultation Note  Patient Name: Suzanne James [id number] Date: 12/20/2020 Reason for consult: NICU baby;Follow-up assessment Age:27 hours   LC to room for bf assist. Infant was sleeping soundly without feeding cues. Mom and I discussed normalcy of behavior. Mom to continue pumping and sts. Will offer pumped breast with cues and according to infant's readiness. LC will f/u in 1-2 days to assess for onset of copious milk and absence of engorgement.   Mom requests SLP check for frenulum because of family hx.   Maternal Data  Today mom is pumping between and . This is a normal volume for day 3pp. She describes signs of +breast changes today.   Feeding Mother's Current Feeding Choice: Breast Milk and Donor Milk   Consult Status Consult Status: Follow-up Follow-up type: In-patient   Elder Negus, MA IBCLC 12/20/2020, 11:06 AM

## 2020-12-21 ENCOUNTER — Ambulatory Visit: Payer: Self-pay

## 2020-12-21 NOTE — Lactation Note (Signed)
This note was copied from a baby's chart. Lactation Consultation Note  Patient Name: Suzanne James [id number] Date: 12/21/2020 Reason for consult: Follow-up assessment;NICU baby;Mother's request;Late-preterm 34-36.6wks Age:26 days   RN called LC for mom's questions regarding flange size.  Mom had pain when pumping and was provided a 27 flange to try instead of the 24 flange.  Mom states pumping with the 27 is more comfortable than the 24 but pumping is still not feeling right.   Mom feels she is unable to empty her breasts when pumping for 30 minutes (when the milk stops dripping).  She feels some full places at the top quadrant of both breasts.      LC assessed moms breasts and nipples.  Right nipple appears slightly red, dry, and irritated.  A very small bump noted on tip of nipple but it doesn't appear to be a blister.  The left nipple appears normal and mom denies pain on the left side.  LC used the belly band and made mom a hands free bra.  Flanges placed on breasts but mom was not scheduled to pump for another hour.  Mom did feel the 27 pulled a bit of the areola into the flange.    Mom was encouraged to use coconut oil when pumping to aid in comfort and to use her hands to gently massage while pumping now that she has a hands free bra.  [employer] website suggested and mom was encouraged to view hand-on-pumping video.  LC suggested hand expressing after pumping to help remove more milk from the breasts. Comfort gels provided for mom.  Lactation appt. Scheduled for observing pump session Monday April 11.   Maternal Data    Feeding Mother's Current Feeding Choice: Breast Milk and Donor Milk  LATCH Score                    Lactation Tools Discussed/Used Tools: Pump Flange Size: 67 (Mom states she was provided a 27 flange to try due to pain during pumping) Breast pump type: Double-Electric Breast Pump Pump Education: Setup, frequency, and cleaning Reason for  Pumping: NICU infant Pumping frequency: Q3 hours Pumped volume: 1.5 mL (1 to 1.5 ounces per pumping session)  Interventions Interventions: Comfort gels;Education (provided firstdroplets[employer]com resource for video of hands-on pumping)  Discharge Discharge Education: Engorgement and breast care Pump: DEBP  Consult Status Consult Status: Follow-up Date: 12/23/20 Follow-up type: In-patient    Maryruth Hancock [facility] 12/21/2020, 2:11 PM

## 2020-12-21 NOTE — Lactation Note (Signed)
This note was copied from a baby's chart. Lactation Consultation Note  Patient Name: Suzanne James [id number] Date: 12/21/2020   Mom resting; asleep in chair.  LC will follow up.  Mom has questions regarding her flanges per RN.   Age:26 days  Maternal Data    Feeding    LATCH Score                    Lactation Tools Discussed/Used    Interventions    Discharge    Consult Status      Maryruth Hancock [facility] 12/21/2020, 1:05 PM

## 2020-12-23 ENCOUNTER — Encounter: Payer: BC Managed Care – PPO | Admitting: Obstetrics & Gynecology

## 2020-12-23 ENCOUNTER — Other Ambulatory Visit: Payer: BC Managed Care – PPO

## 2020-12-23 ENCOUNTER — Ambulatory Visit: Payer: Self-pay

## 2020-12-23 NOTE — Lactation Note (Signed)
This note was copied from a baby's chart. Lactation Consultation Note LC to room for f/u visit. Infant progressing on IDF, per mom, and advised to continue pre-pumping for bf. We reviewed biological positioning and modified football hold to slow flow. Will return Wednesday at 1100 for observed feeding and to assist prn. Patient Name: Suzanne James [id number] Date: 12/23/2020 Reason for consult: NICU baby;Follow-up assessment Age:26 days  Maternal Data  Mom is pumping about per session. Reviewed that volume is hormonally driven at this time. Advised to continue frequent breast stimulation for milk removal and mom's comfort. Previous pumping pain/blister has resolved.   Consult Status Consult Status: Follow-up Follow-up type: In-patient   Suzanne Negus, MA IBCLC 12/23/2020, 4:49 PM

## 2020-12-25 ENCOUNTER — Other Ambulatory Visit: Payer: Self-pay | Admitting: Women's Health

## 2020-12-25 ENCOUNTER — Telehealth: Payer: Self-pay

## 2020-12-25 ENCOUNTER — Telehealth: Payer: Self-pay | Admitting: Obstetrics & Gynecology

## 2020-12-25 ENCOUNTER — Encounter: Payer: Self-pay | Admitting: *Deleted

## 2020-12-25 ENCOUNTER — Ambulatory Visit: Payer: Self-pay

## 2020-12-25 MED ORDER — AMLODIPINE BESYLATE 5 MG PO TABS: 5.0000 mg | ORAL_TABLET | Freq: Every day | ORAL | 11 refills | Status: DC

## 2020-12-25 NOTE — Telephone Encounter (Signed)
[employer] called and stated there was a BP of 140/101 reported

## 2020-12-25 NOTE — Telephone Encounter (Signed)
[facility] left a messages on the after hours line reporting an elevated Blood pressure, didn't leave the readings.

## 2020-12-25 NOTE — Lactation Note (Signed)
This note was copied from a baby's chart. Lactation Consultation Note  Patient Name: Suzanne James [id number] Date: 12/25/2020   Age:26 days   LC attempted to consult with Mom, but she didn't come for 11 am appointment.  RN to call [facility] when Mom comes to visit.   Judee Clara 12/25/2020, 1:31 PM

## 2020-12-26 ENCOUNTER — Other Ambulatory Visit: Payer: Self-pay

## 2020-12-26 ENCOUNTER — Telehealth (INDEPENDENT_AMBULATORY_CARE_PROVIDER_SITE_OTHER): Payer: BC Managed Care – PPO

## 2020-12-26 ENCOUNTER — Other Ambulatory Visit: Payer: BC Managed Care – PPO

## 2020-12-26 DIAGNOSIS — Z0130 Encounter for examination of blood pressure without abnormal findings: Secondary | ICD-10-CM

## 2020-12-26 DIAGNOSIS — O10919 Unspecified pre-existing hypertension complicating pregnancy, unspecified trimester: Secondary | ICD-10-CM

## 2020-12-26 NOTE — Progress Notes (Addendum)
   I connected with  Suzanne James on 12/26/20 by a video enabled telemedicine application in the office and verified that I am speaking with the correct person using two identifiers. Patient: Residence Provider: Office   I discussed the limitations of evaluation and management by telemedicine. The patient expressed understanding and agreed to proceed.   NURSE VISIT- BLOOD PRESSURE CHECK  SUBJECTIVE:  Suzanne James is a 25 y.o. [id number] female here for BP check. She is postpartum, delivery date 12/17/20    HYPERTENSION ROS:  Pregnant/postpartum:  . Severe headaches that don't go away with tylenol/other medicines: No  . Visual changes (seeing spots/double/blurred vision) No  . Severe pain under right breast breast or in center of upper chest No  . Severe nausea/vomiting No  . Taking medicines as instructed yes  OBJECTIVE:  BP (!) 120/95 (BP Location: Left Arm, Patient Position: Sitting, Cuff Size: Normal)   Pulse 63   Breastfeeding Yes   Appearance oriented to person, place, and time.  ASSESSMENT: Postpartum  blood pressure check  PLAN: Discussed with Dr. Charlotta Newton   Recommendations: no changes needed   Follow-up: as scheduled   Gaius Ishaq A Dequante Tremaine  12/26/2020 10:24 AM

## 2020-12-30 ENCOUNTER — Other Ambulatory Visit: Payer: BC Managed Care – PPO

## 2020-12-30 ENCOUNTER — Encounter: Payer: BC Managed Care – PPO | Admitting: Women's Health

## 2021-01-02 ENCOUNTER — Other Ambulatory Visit: Payer: BC Managed Care – PPO

## 2021-01-04 ENCOUNTER — Ambulatory Visit: Payer: Self-pay

## 2021-01-04 NOTE — Lactation Note (Signed)
This note was copied from a baby's chart. Lactation Consultation Note LC to room for f/u visit. Per mom's recall, infant is bf'ing well with audible swallows and breast softening. Mother pumps briefly before bf'ing, removing 30-73mls. Mother denies questions / concerns at this time. She is aware of LC services.   Patient Name: Suzanne James [id number] Date: 01/04/2021 Reason for consult: NICU baby;Follow-up assessment Age:26 wk.o.  Maternal Data  Mother pumps 180 mls 5-8 x day.  Feeding Mother's Current Feeding Choice: Breast Milk   Consult Status Consult Status: Follow-up Follow-up type: In-patient    Elder Negus, MA IBCLC 01/04/2021, 9:41 AM

## 2021-01-06 ENCOUNTER — Other Ambulatory Visit: Payer: BC Managed Care – PPO

## 2021-01-06 ENCOUNTER — Encounter: Payer: BC Managed Care – PPO | Admitting: Obstetrics & Gynecology

## 2021-01-07 ENCOUNTER — Ambulatory Visit: Payer: Self-pay

## 2021-01-07 NOTE — Lactation Note (Signed)
This note was copied from a baby's chart. Lactation Consultation Note LC to room for observed bf as mom and baby begin 12-hour bf trial. Baby woke with cares and latched quickly when placed at breast. LC observed rhythmic suckling with audible swallowing. Baby took brief appropriate breaks and resumed without pestering. Active feeding continued for over 18 minutes. Baby unlatched independently and slept at breast p bf.   Patient Name: Suzanne James [id number] Date: 01/07/2021 Reason for consult: NICU baby;Follow-up assessment;Other (Comment) (observed bf) Age:26 wk.o.   Feeding Mother's Current Feeding Choice: Breast Milk  LATCH Score Latch: Grasps breast easily, tongue down, lips flanged, rhythmical sucking.  Audible Swallowing: Spontaneous and intermittent  Type of Nipple: Everted at rest and after stimulation  Comfort (Breast/Nipple): Soft / non-tender  Hold (Positioning): No assistance needed to correctly position infant at breast.  LATCH Score: 10   Consult Status Consult Status: Follow-up Follow-up type: In-patient   Elder Negus, MA IBCLC 01/07/2021, 12:29 PM

## 2021-01-09 ENCOUNTER — Other Ambulatory Visit: Payer: BC Managed Care – PPO

## 2021-01-10 ENCOUNTER — Ambulatory Visit: Payer: Self-pay

## 2021-01-10 NOTE — Lactation Note (Signed)
This note was copied from a baby's chart. Lactation Consultation Note  Patient Name: Girl Bridgitte Felicetti [id number] Date: 01/10/2021 Reason for consult: Follow-up assessment;NICU baby Age:26 wk.o.  Lactation conducted a discharge visit with Ms. Woolsey. We reviewed her feeding plan. She states that she is giving baby Jozephine a bottle (fortified) 3x/day. She is breast feeding all other feedings.   She asked if she needed to continue pre-pumping. I discouraged pre-pumping unless she has a strong let-down. She states that she does have a strong let-down in the early am hours, and she has been letting her milk down into a towel first. We discussed not pre-pumping otherwise and to occasionally allow baby to latch without pre-pumping in the am to see how she handles her let-down.  She also pumps for those three bottle feedings. She pumps between 4-8 ounces/combined. Her milk production is stronger in the am.  She states that baby is latching well and softening her breast tissue. I recommended offering both breasts in the evening feedings when her milk volume is a bit lower. I also educated on warning signs for breast feeding and signs that baby is getting enough to eat (weight gain/daily, output, behavior, feeding frequency, etc.).  Ms. Debo states that she has our contact informations. I encouraged a follow up OP appointment and she agreed that I could put in a referral.  Ms. Booher plans to follow up with [facility] in [place] on Monday.  All questions answered at this time.    Feeding Mother's Current Feeding Choice: Breast Milk Nipple Type: Dr. Levert Feinstein Preemie  LATCH Score Latch: Grasps breast easily, tongue down, lips flanged, rhythmical sucking.  Audible Swallowing: Spontaneous and intermittent  Type of Nipple: Everted at rest and after stimulation  Comfort (Breast/Nipple): Soft / non-tender  Hold (Positioning): No assistance needed to correctly position infant at  breast.  LATCH Score: 10   Lactation Tools Discussed/Used Breast pump type: Double-Electric Breast Pump Pump Education: Setup, frequency, and cleaning Reason for Pumping: NICU Pumping frequency: 3 times/day Pumped volume: 220 mL  Interventions Interventions: Education  Discharge Discharge Education: Outpatient recommendation;Outpatient Epic message sent;Warning signs for feeding baby  Consult Status Consult Status: Complete Date: 01/10/21 Follow-up type: In-patient    Walker Shadow 01/10/2021, 10:30 AM

## 2021-01-13 ENCOUNTER — Encounter: Payer: BC Managed Care – PPO | Admitting: Obstetrics & Gynecology

## 2021-01-13 ENCOUNTER — Other Ambulatory Visit: Payer: BC Managed Care – PPO

## 2021-01-16 ENCOUNTER — Other Ambulatory Visit: Payer: BC Managed Care – PPO

## 2021-01-20 ENCOUNTER — Other Ambulatory Visit: Payer: BC Managed Care – PPO

## 2021-01-20 ENCOUNTER — Encounter: Payer: BC Managed Care – PPO | Admitting: Obstetrics & Gynecology

## 2021-01-23 ENCOUNTER — Other Ambulatory Visit: Payer: BC Managed Care – PPO

## 2021-01-24 ENCOUNTER — Other Ambulatory Visit: Payer: Self-pay | Admitting: Women's Health

## 2021-01-24 MED ORDER — DICLOXACILLIN SODIUM 500 MG PO CAPS: 500.0000 mg | ORAL_CAPSULE | Freq: Four times a day (QID) | ORAL | 0 refills | Status: DC

## 2021-01-28 ENCOUNTER — Encounter: Payer: Self-pay | Admitting: Women's Health

## 2021-01-28 ENCOUNTER — Ambulatory Visit (INDEPENDENT_AMBULATORY_CARE_PROVIDER_SITE_OTHER): Payer: BC Managed Care – PPO | Admitting: Women's Health

## 2021-01-28 ENCOUNTER — Other Ambulatory Visit: Payer: Self-pay

## 2021-01-28 DIAGNOSIS — Z8632 Personal history of gestational diabetes: Secondary | ICD-10-CM | POA: Diagnosis not present

## 2021-01-28 DIAGNOSIS — Z8751 Personal history of pre-term labor: Secondary | ICD-10-CM | POA: Diagnosis not present

## 2021-01-28 DIAGNOSIS — Z3202 Encounter for pregnancy test, result negative: Secondary | ICD-10-CM | POA: Diagnosis not present

## 2021-01-28 DIAGNOSIS — I10 Essential (primary) hypertension: Secondary | ICD-10-CM

## 2021-01-28 LAB — POCT URINE PREGNANCY: Preg Test, Ur: NEGATIVE

## 2021-01-28 MED ORDER — NORETHINDRONE 0.35 MG PO TABS: 1.0000 | ORAL_TABLET | Freq: Every day | ORAL | 11 refills | Status: DC

## 2021-01-28 NOTE — Progress Notes (Signed)
POSTPARTUM VISIT Patient name: Suzanne James MRN [id number]  Date of birth: Jun 15, 1996 Chief Complaint:   Postpartum Care (Interested in birth control pills)  History of Present Illness:   Suzanne James is a 25 y.o. [phone] Caucasian female being seen today for a postpartum visit. She is 6 weeks postpartum following a spontaneous vaginal delivery at 34.1 gestational weeks. IOL: yes, for PPROM  and n/a. Anesthesia: IV Fentanyl.  Laceration: none.  Complications: PPH 100FH requiring cytotec, TXA, LUS sweep. Inpatient contraception: no.   Pregnancy complicated by Q1FX, CHTN dx @ 16wks. Tobacco use: no. Substance use disorder: no. Last pap smear: 12/2019 and results were negative per pt report at [facility]. Next pap smear due: 2024 No LMP recorded.  Postpartum course has been complicated by elevated bp, so norvasc 46m rx'd 1wk pp, still taking. Mastitis/clogged duct, on dicloxacillin, feeling much better. Bleeding none. Bowel function is normal. Bladder function is normal. Urinary incontinence? no, fecal incontinence? no Patient is not sexually active. Last sexual activity: prior to birth of baby. Desired contraception: POPs. Patient does not want a pregnancy in the future.  Desired family size is 3 children.   The pregnancy intention screening data noted above was reviewed. Potential methods of contraception were discussed. The patient elected to proceed with Oral Contraceptive.   Edinburgh Postpartum Depression Screening: negative  Edinburgh Postnatal Depression Scale - 01/28/21 1151      Edinburgh Postnatal Depression Scale:  In the Past 7 Days   I have been able to laugh and see the funny side of things. 0    I have looked forward with enjoyment to things. 0    I have blamed myself unnecessarily when things went wrong. 1    I have been anxious or worried for no good reason. 1    I have felt scared or panicky for no good reason. 1    Things have been getting on top of me. 1     I have been so unhappy that I have had difficulty sleeping. 1    I have felt sad or miserable. 1    I have been so unhappy that I have been crying. 1    The thought of harming myself has occurred to me. 0    Edinburgh Postnatal Depression Scale Total 7          Baby's course has been complicated by prematurity, 4wk NICU stay, doing well now. Baby is feeding by breast: milk supply adequate. Infant has a pediatrician/family doctor? Yes.  Childcare strategy if returning to work/school: n/a-stay at home mom.  Pt has material needs met for her and baby: Yes.   Review of Systems:   Pertinent items are noted in HPI Denies Abnormal vaginal discharge w/ itching/odor/irritation, headaches, visual changes, shortness of breath, chest pain, abdominal pain, severe nausea/vomiting, or problems with urination or bowel movements. Pertinent History Reviewed:  Reviewed past medical,surgical, obstetrical and family history.  Reviewed problem list, medications and allergies. OB History  Gravida Para Term Preterm AB Living  '3 3 2 1   3  ' SAB IAB Ectopic Multiple Live Births          3    # Outcome Date GA Lbr Len/2nd Weight Sex Delivery Anes PTL Lv  3 Preterm 12/17/20 [id number] lb 8 oz (2.041 kg) F Vag-Spont   LIV  2 Term 09/27/17 3927w0d lb 6 oz (2.892 kg) F Vag-Spont EPI N LIV  1 Term 05/20/15 37w73w0d  lb 8 oz (2.495 kg) F Vag-Spont EPI N LIV     Complications: IUGR (intrauterine growth retardation) of newborn, Gestational diabetes, Gestational hypertension   Physical Assessment:   Vitals:   01/28/21 1154  BP: 109/73  Pulse: 60  Weight: 195 lb 8 oz (88.7 kg)  Height: '5\' 3"'  (1.6 m)  Body mass index is 34.63 kg/m.       Physical Examination:   General appearance: alert, well appearing, and in no distress  Mental status: alert, oriented to person, place, and time  Skin: warm & dry   Cardiovascular: normal heart rate noted   Respiratory: normal respiratory effort, no distress   Breasts:  deferred, no complaints   Abdomen: soft, non-tender   Pelvic: examination not indicated. Thin prep pap obtained: No  Rectal: not examined  Extremities: Edema: none   Chaperone: N/A         Results for orders placed or performed in visit on 01/28/21 (from the past 24 hour(s))  POCT urine pregnancy   Collection Time: 01/28/21 11:58 AM  Result Value Ref Range   Preg Test, Ur Negative Negative    Assessment & Plan:  1) Postpartum exam 2) 6 wks s/p spontaneous vaginal delivery @ 34.1wks d/t PPROM 3) breast feeding 4) Depression screening 5) Contraception management: Rx micronor w/ 11RF, understands has to take at exact same time daily to be effective, if late taking use condom as back-up  6) PPH 7) A1DM> schedule GTT 8) CHTN> dx @ 16wks, on norvasc 39m. Stop today, f/u in 2wks for bp check w/ nurse  Essential components of care per ACOG recommendations:  1.  Mood and well being:  . If positive depression screen, discussed and plan developed.  . If using tobacco we discussed reduction/cessation and risk of relapse . If current substance abuse, we discussed and referral to local resources was offered.   2. Infant care and feeding:  . If breastfeeding, discussed returning to work, pumping, breastfeeding-associated pain, guidance regarding return to fertility while lactating if not using another method. If needed, patient was provided with a letter to be allowed to pump q 2-3hrs to support lactation in a private location with access to a refrigerator to store breastmilk.   . Recommended that all caregivers be immunized for flu, pertussis and other preventable communicable diseases . If pt does not have material needs met for her/baby, referred to local resources for help obtaining these.  3. Sexuality, contraception and birth spacing . Provided guidance regarding sexuality, management of dyspareunia, and resumption of intercourse . Discussed avoiding interpregnancy interval <645ms and  recommended birth spacing of 18 months  4. Sleep and fatigue . Discussed coping options for fatigue and sleep disruption . Encouraged family/partner/community support of 4 hrs of uninterrupted sleep to help with mood and fatigue  5. Physical recovery  . If pt had a C/S, assessed incisional pain and providing guidance on normal vs prolonged recovery . If pt had a laceration, perineal healing and pain reviewed.  . If urinary or fecal incontinence, discussed management and referred to PT or uro/gyn if indicated  . Patient is safe to resume physical activity. Discussed attainment of healthy weight.  6.  Chronic disease management . Discussed pregnancy complications if any, and their implications for future childbearing and long-term maternal health. . Review recommendations for prevention of recurrent pregnancy complications, such as 17 hydroxyprogesterone caproate to reduce risk for recurrent PTB yes, or aspirin to reduce risk of preeclampsia yes. . Pt had GDM:  yes. If yes, 2hr GTT scheduled: yes. . Reviewed medications and non-pregnant dosing including consideration of whether pt is breastfeeding using a reliable resource such as LactMed: yes . Referred for f/u w/ PCP or subspecialist providers as indicated: not applicable  7. Health maintenance . Mammogram at 26yo or earlier if indicated . Pap smears as indicated  Meds:  Meds ordered this encounter  Medications  . norethindrone (MICRONOR) 0.35 MG tablet    Sig: Take 1 tablet (0.35 mg total) by mouth daily.    Dispense:  28 tablet    Refill:  11    Order Specific Question:   Supervising Provider    Answer:   Florian Buff [[id number]]    Follow-up: Return in about 2 weeks (around 02/11/2021) for 2hr sugar test and bp check w/ nurse; then 85yrfor physical.   Orders Placed This Encounter  Procedures  . POCT urine pregnancy    [facility]5/17/2022 12:29 PM

## 2021-01-28 NOTE — Patient Instructions (Addendum)
Stop norvasc  You will have your sugar test next visit.  Please do not eat or drink anything after midnight the night before you come, not even water.  You will be here for at least two hours.  Please make an appointment online for the bloodwork at [email or web address] for 8:30am (or as close to this as possible). Make sure you select the [facility] service center. The day of the appointment, check in with our office first, then you will go to [facility] to start the sugar test.

## 2021-02-11 ENCOUNTER — Other Ambulatory Visit: Payer: BC Managed Care – PPO

## 2021-02-11 ENCOUNTER — Other Ambulatory Visit: Payer: Self-pay

## 2021-02-11 ENCOUNTER — Ambulatory Visit (INDEPENDENT_AMBULATORY_CARE_PROVIDER_SITE_OTHER): Payer: BC Managed Care – PPO | Admitting: *Deleted

## 2021-02-11 VITALS — BP 125/84 | HR 75

## 2021-02-11 DIAGNOSIS — Z8759 Personal history of other complications of pregnancy, childbirth and the puerperium: Secondary | ICD-10-CM

## 2021-02-11 DIAGNOSIS — Z0130 Encounter for examination of blood pressure without abnormal findings: Secondary | ICD-10-CM

## 2021-02-11 NOTE — Progress Notes (Addendum)
   NURSE VISIT- BLOOD PRESSURE CHECK  SUBJECTIVE:  Suzanne James is a 25 y.o. [phone] female here for BP check. She is postpartum, delivery date 12/17/20    HYPERTENSION ROS:  Postpartum:  . Severe headaches that don't go away with tylenol/other medicines: No  . Visual changes (seeing spots/double/blurred vision) No  . Severe pain under right breast breast or in center of upper chest No  . Severe nausea/vomiting No  . Taking medicines as instructed not applicable   OBJECTIVE:  BP 125/84 (BP Location: Right Arm, Patient Position: Sitting, Cuff Size: Normal)   Pulse 75   Breastfeeding Yes   Appearance alert, well appearing, and in no distress and oriented to person, place, and time.  ASSESSMENT: Postpartum  blood pressure check  PLAN: Discussed with Joellyn Haff, CNM, [facility]   Recommendations: no changes needed   Follow-up: as scheduled   Jobe Marker  02/11/2021 12:41 PM   Chart reviewed for nurse visit. Agree with plan of care.  Cheral Marker, [place] 02/11/2021 3:18 PM

## 2021-02-12 LAB — GLUCOSE TOLERANCE, 2 HOURS W/ 1HR
Glucose, 1 hour: 135 mg/dL (ref 65–179)
Glucose, 2 hour: 102 mg/dL (ref 65–152)
Glucose, Fasting: 74 mg/dL (ref 65–91)

## 2021-03-05 ENCOUNTER — Encounter: Payer: Self-pay | Admitting: Women's Health

## 2021-03-05 ENCOUNTER — Other Ambulatory Visit: Payer: Self-pay

## 2021-03-05 ENCOUNTER — Telehealth (INDEPENDENT_AMBULATORY_CARE_PROVIDER_SITE_OTHER): Payer: BC Managed Care – PPO | Admitting: Women's Health

## 2021-03-05 VITALS — BP 125/69 | HR 63 | Ht 63.0 in

## 2021-03-05 DIAGNOSIS — R454 Irritability and anger: Secondary | ICD-10-CM

## 2021-03-05 DIAGNOSIS — F419 Anxiety disorder, unspecified: Secondary | ICD-10-CM | POA: Diagnosis not present

## 2021-03-05 DIAGNOSIS — O99893 Other specified diseases and conditions complicating puerperium: Secondary | ICD-10-CM | POA: Diagnosis not present

## 2021-03-05 DIAGNOSIS — O99345 Other mental disorders complicating the puerperium: Secondary | ICD-10-CM

## 2021-03-05 MED ORDER — SERTRALINE HCL 50 MG PO TABS: 50.0000 mg | ORAL_TABLET | Freq: Every day | ORAL | 3 refills | Status: DC

## 2021-03-05 NOTE — Progress Notes (Signed)
TELEHEALTH VIRTUAL GYN VISIT ENCOUNTER NOTE Patient name: Suzanne James MRN [id number]  Date of birth: Oct 02, 1995  I connected with patient on 03/05/21 at  2:30 PM EDT by mychart and verified that I am speaking with the correct person using two identifiers.  Pt is not currently in the office, she is at home.  Provider is in the office.    I discussed the limitations, risks, security and privacy concerns of performing an evaluation and management service by telephone and the availability of in person appointments. I also discussed with the patient that there may be a patient responsible charge related to this service. The patient expressed understanding and agreed to proceed.   Chief Complaint:   having mood swings  History of Present Illness:   Suzanne James is a 25 y.o. [phone] Caucasian female s/p SVB being evaluated today for report of feeling a lot of anger over nothing x 1wk, randomly started happening. Denies SI/HI/II. Currently on zoloft 25mg .  EPDS today 8 (was 7). Depression screen [facility] 2/9 11/04/2020 07/18/2020 06/14/2020  Decreased Interest 1 1 0  Down, Depressed, Hopeless 1 0 0  PHQ - 2 Score 2 1 0  Altered sleeping 3 3 0  Tired, decreased energy 2 2 0  Change in appetite 3 3 0  Feeling bad or failure about yourself  0 0 0  Trouble concentrating 1 2 0  Moving slowly or fidgety/restless 0 0 0  Suicidal thoughts 0 0 0  PHQ-9 Score 11 11 0  Difficult doing work/chores - - Not difficult at all    No LMP recorded. The current method of family planning is oral progesterone-only contraceptive.  Last pap 12/2019. Results were:  negative per pt report at [facility] Review of Systems:   Pertinent items are noted in HPI Denies fever/chills, dizziness, headaches, visual disturbances, fatigue, shortness of breath, chest pain, abdominal pain, vomiting, abnormal vaginal discharge/itching/odor/irritation, problems with periods, bowel movements, urination, or intercourse  unless otherwise stated above.  Pertinent History Reviewed:  Reviewed past medical,surgical, social, obstetrical and family history.  Reviewed problem list, medications and allergies. Physical Assessment:   Vitals:   03/05/21 1438  BP: 125/69  Pulse: 63  Height: 5\' 3"  (1.6 m)  Body mass index is 34.63 kg/m.       Physical Examination:   General:  Alert, oriented and cooperative.   Mental Status: Normal mood and affect perceived. Normal judgment and thought content.  Physical exam deferred due to nature of the encounter  No results found for this or any previous visit (from the past 24 hour(s)).  Assessment & Plan:  1) 03/07/21 s/p SVB  2) Anxiety w/ anger/irritability> increase zoloft to 50mg , IBH referral, f/u 4wks  Meds:  Meds ordered this encounter  Medications   sertraline (ZOLOFT) 50 MG tablet    Sig: Take 1 tablet (50 mg total) by mouth daily.    Dispense:  90 tablet    Refill:  3    Order Specific Question:   Supervising Provider    Answer:   [[id number]]    Orders Placed This Encounter  Procedures   Amb ref to Integrated Behavioral [facility]     I discussed the assessment and treatment plan with the patient. The patient was provided an opportunity to ask questions and all were answered. The patient agreed with the plan and demonstrated an understanding of the instructions.   The patient was advised to call back or seek an in-person evaluation/go  to the ED if the symptoms worsen or if the condition fails to improve as anticipated.  I provided 15 minutes of non-face-to-face time during this encounter.   Return in about 4 weeks (around 04/02/2021) for med f/u, MyChart Video, with me.  Cheral Marker CNM, [facility] 03/05/2021 3:04 PM

## 2021-04-02 ENCOUNTER — Encounter: Payer: Self-pay | Admitting: Women's Health

## 2021-04-02 ENCOUNTER — Telehealth (INDEPENDENT_AMBULATORY_CARE_PROVIDER_SITE_OTHER): Payer: BC Managed Care – PPO | Admitting: Women's Health

## 2021-04-02 VITALS — Ht 63.0 in

## 2021-04-02 DIAGNOSIS — R109 Unspecified abdominal pain: Secondary | ICD-10-CM | POA: Diagnosis not present

## 2021-04-02 DIAGNOSIS — M25552 Pain in left hip: Secondary | ICD-10-CM

## 2021-04-02 DIAGNOSIS — R454 Irritability and anger: Secondary | ICD-10-CM | POA: Diagnosis not present

## 2021-04-02 DIAGNOSIS — F419 Anxiety disorder, unspecified: Secondary | ICD-10-CM | POA: Diagnosis not present

## 2021-04-02 NOTE — Progress Notes (Signed)
TELEHEALTH VIRTUAL GYN VISIT ENCOUNTER NOTE Patient name: Suzanne James MRN [id number]  Date of birth: 1995/10/22  I connected with patient on 04/02/21 at  1:30 PM EDT by phone (unable to get mychart to work)  and verified that I am speaking with the correct person using two identifiers.  Pt is not currently in the office, she is at home.  Provider is in the office.    I discussed the limitations, risks, security and privacy concerns of performing an evaluation and management service by telephone and the availability of in person appointments. I also discussed with the patient that there may be a patient responsible charge related to this service. The patient expressed understanding and agreed to proceed.   Chief Complaint:   Follow-up (Pt feels a lot better!!!)  History of Present Illness:   Suzanne James is a 25 y.o. [phone] Caucasian female being evaluated today for f/u on zoloft 50mg . Increased from 25mg  on 6/22 for anxiety w/ anger/irritability. States she can definitely feel an improvement. Feels dose is good.  Constant pain Lt hip/flank x 26mth, worse at night and in morning and when bending or with activity. No blood in urine she is able to see. APAP and ibuprofen help some. NO uti sx. Has h/o kidney stones, not sure if feels the same.     EPDS today 4, was 8 at last visit  Depression screen [facility] 2/9 04/02/2021 11/04/2020 07/18/2020 06/14/2020  Decreased Interest 0 1 1 0  Down, Depressed, Hopeless 0 1 0 0  PHQ - 2 Score 0 2 1 0  Altered sleeping 1 3 3  0  Tired, decreased energy 1 2 2  0  Change in appetite 0 3 3 0  Feeling bad or failure about yourself  0 0 0 0  Trouble concentrating 3 1 2  0  Moving slowly or fidgety/restless 0 0 0 0  Suicidal thoughts 0 0 0 0  PHQ-9 Score 5 11 11  0  Difficult doing work/chores - - - Not difficult at all    No LMP recorded. The current method of family planning is oral progesterone-only contraceptive.  Last pap 12/2019. Results were:  negative  per pt report at [facility] Review of Systems:   Pertinent items are noted in HPI Denies fever/chills, dizziness, headaches, visual disturbances, fatigue, shortness of breath, chest pain, abdominal pain, vomiting, abnormal vaginal discharge/itching/odor/irritation, problems with periods, bowel movements, urination, or intercourse unless otherwise stated above.  Pertinent History Reviewed:  Reviewed past medical,surgical, social, obstetrical and family history.  Reviewed problem list, medications and allergies. Physical Assessment:   Vitals:   04/02/21 1335  Height: 5\' 3"  (1.6 m)  Body mass index is 34.63 kg/m.       Physical Examination:   General:  Alert, oriented and cooperative.   Mental Status: Normal mood and affect perceived. Normal judgment and thought content.  Physical exam deferred due to nature of the encounter  No results found for this or any previous visit (from the past 24 hour(s)).  Assessment & Plan:  1) s/p SVB  2) Anxiety w/ anger/irritability> doing well on zoloft 50mg , continue.   3) Lt hip/flank pain x 62mth> h/o kidney stones, come in for nurse urine visit, if neg consider flexeril  Meds: No orders of the defined types were placed in this encounter.   No orders of the defined types were placed in this encounter.   I discussed the assessment and treatment plan with the patient. The patient was provided an  opportunity to ask questions and all were answered. The patient agreed with the plan and demonstrated an understanding of the instructions.   The patient was advised to call back or seek an in-person evaluation/go to the ED if the symptoms worsen or if the condition fails to improve as anticipated.  I provided 10 minutes of non-face-to-face time during this encounter.   Return for tomorrow for nurse urine check.  Cheral Marker CNM, [facility] 04/02/2021 2:08 PM

## 2021-04-03 ENCOUNTER — Other Ambulatory Visit: Payer: Self-pay

## 2021-04-03 ENCOUNTER — Other Ambulatory Visit (INDEPENDENT_AMBULATORY_CARE_PROVIDER_SITE_OTHER): Payer: BC Managed Care – PPO

## 2021-04-03 DIAGNOSIS — R399 Unspecified symptoms and signs involving the genitourinary system: Secondary | ICD-10-CM | POA: Diagnosis not present

## 2021-04-03 LAB — POCT URINALYSIS DIPSTICK OB
Blood, UA: NEGATIVE
Glucose, UA: NEGATIVE
Ketones, UA: NEGATIVE
Nitrite, UA: NEGATIVE

## 2021-04-03 NOTE — Progress Notes (Addendum)
   NURSE VISIT- UTI SYMPTOMS   SUBJECTIVE:  Suzanne James is Suzanne 25 y.o. [phone] female here for UTI symptoms. She is postpartum. She reports flank pain on the left.  OBJECTIVE:  There were no vitals taken for this visit.  Appears well, in no apparent distress  Results for orders placed or performed in visit on 04/03/21 (from the past 24 hour(s))  POC Urinalysis Dipstick OB   Collection Time: 04/03/21  3:50 PM  Result Value Ref Range   Color, UA     Clarity, UA     Glucose, UA Negative Negative   Bilirubin, UA     Ketones, UA neg    Spec Grav, UA     Blood, UA neg    pH, UA     POC,PROTEIN,UA Trace Negative, Trace, Small (1+), Moderate (2+), Large (3+), 4+   Urobilinogen, UA     Nitrite, UA neg    Leukocytes, UA Trace (Suzanne) Negative   Appearance     Odor      ASSESSMENT: Postpartum with UTI symptoms and negative nitrites  PLAN: Note routed to Suzanne James, CNM, [facility]   Rx sent by provider today: No Urine culture sent Call or return to clinic prn if these symptoms worsen or fail to improve as anticipated. Follow-up: as needed   Suzanne James Suzanne James  04/03/2021 3:52 PM   Chart reviewed for nurse visit. Agree with plan of care.  Suzanne James, [place] 04/04/2021 8:28 AM

## 2021-04-05 LAB — URINE CULTURE: Organism ID, Bacteria: NO GROWTH

## 2021-10-14 ENCOUNTER — Encounter: Payer: Self-pay | Admitting: Women's Health

## 2021-10-28 ENCOUNTER — Encounter: Payer: Self-pay | Admitting: Neurology

## 2021-10-28 ENCOUNTER — Other Ambulatory Visit: Payer: Self-pay

## 2021-10-28 ENCOUNTER — Ambulatory Visit: Payer: Medicaid Other | Admitting: Neurology

## 2021-10-28 VITALS — BP 113/76 | HR 89 | Ht 64.0 in | Wt 210.0 lb

## 2021-10-28 DIAGNOSIS — G44209 Tension-type headache, unspecified, not intractable: Secondary | ICD-10-CM

## 2021-10-28 DIAGNOSIS — R569 Unspecified convulsions: Secondary | ICD-10-CM | POA: Diagnosis not present

## 2021-10-28 MED ORDER — CYCLOBENZAPRINE HCL 10 MG PO TABS: 10.0000 mg | ORAL_TABLET | Freq: Three times a day (TID) | ORAL | 0 refills | Status: DC | PRN

## 2021-10-28 NOTE — Progress Notes (Signed)
[facility]  PATIENT: Suzanne RodneyCaitlin Grupe DOB: [birth year]  REQUESTING CLINICIAN: Lovey NewcomerBoyd, William S, PA HISTORY FROM: Patient  REASON FOR VISIT: Seizure like activity/syncope    HISTORICAL  CHIEF COMPLAINT:  Chief Complaint  Patient presents with   New Patient (Initial Visit)    Rm 14 Pt c/o headaches and dizziness since sz like episode     HISTORY OF PRESENT ILLNESS:  This is a 26 year old woman with no reported past medical history who is presenting after a syncopal versus seizure.  Patient reports on February 3 she was sitting at home having dinner with husband and next thing that she remembers is thinking waking up from her bed and her husband telling her goodbye as he was getting ready for work.  She was told by her husband that he had she had a seizure.  She reported that husband saw her dropping her head, he called her name but she was unresponsive then she pushed the table and started kicking her legs.  She was held by her husband therefore she did not fall to the ground.  Entire episode lasted about a minute.  She presented to the ED, initial work-up negative and a head CT was also negative.  She was discharged home with close neuro follow-up.  She does report a history of being clumsy, dropping things unsure if these are myoclonic jerks or not, denies any family history of seizures, reported history of head trauma, no previous history of syncope or seizure-like activity. Patient also reports since the event she has been having constant frontal headache, currently 4 out of 10 and some episode of lightheadedness.  She takes Tylenol which provide some relief but does not take the headaches away. She has a 27-month-old baby and she is breast-feeding, reports sleep is okay will get 8 hours per night.   Handedness: Right   Onset: 2 weeks ago   Seizure Type: unclear, syncope vs. seizure  Current frequency: Only one   Any injuries from seizures: None   Seizure  risk factors: None reported, history of head trauma  Previous ASMs: None  Currenty ASMs: None  ASMs side effects: Not applicable  Brain Images: head CT normal   Previous EEGs: None   OTHER MEDICAL CONDITIONS: None reported   REVIEW OF SYSTEMS: Full 14 system review of systems performed and negative with exception of: as noted in the HPI   ALLERGIES: No Known Allergies  HOME MEDICATIONS: Outpatient Medications Prior to Visit  Medication Sig Dispense Refill   acetaminophen (TYLENOL) 325 MG tablet Take 650 mg by mouth every 6 (six) hours as needed for mild pain or headache.     ferrous sulfate 325 (65 FE) MG tablet Take 1 tablet (325 mg total) by mouth every other day. 45 tablet 2   ibuprofen (ADVIL) 600 MG tablet Take 1 tablet (600 mg total) by mouth every 6 (six) hours as needed. 30 tablet 0   norethindrone (MICRONOR) 0.35 MG tablet Take 1 tablet by mouth daily.     Prenatal Vit-Fe Fumarate-FA (PRENATAL MULTIVITAMIN) TABS tablet Take 1 tablet by mouth.     sertraline (ZOLOFT) 50 MG tablet Take 1 tablet (50 mg total) by mouth daily. 90 tablet 3   No facility-administered medications prior to visit.    PAST MEDICAL HISTORY: Past Medical History:  Diagnosis Date   Strep throat     PAST SURGICAL HISTORY: Past Surgical History:  Procedure Laterality Date   TONSILLECTOMY      FAMILY HISTORY: Family  History  Problem Relation Age of Onset   Asthma Sister    Hypertension Sister    Heart attack Maternal Grandmother    Hypertension Paternal Grandmother    Diabetes Paternal Grandmother     SOCIAL HISTORY: Social History   Socioeconomic History   Marital status: Married    Spouse name: Not on file   Number of children: Not on file   Years of education: Not on file   Highest education level: Not on file  Occupational History   Not on file  Tobacco Use   Smoking status: Never   Smokeless tobacco: Never  Vaping Use   Vaping Use: Never used  Substance and Sexual  Activity   Alcohol use: No   Drug use: No   Sexual activity: Yes    Birth control/protection: Pill  Other Topics Concern   Not on file  Social History Narrative   Not on file   Social Determinants of Health   Financial Resource Strain: Low Risk    Difficulty of Paying Living Expenses: Not hard at all  Food Insecurity: No Food Insecurity   Worried About Programme researcher, broadcasting/film/video in the Last Year: Never true   Ran Out of Food in the Last Year: Never true  Transportation Needs: No Transportation Needs   Lack of Transportation (Medical): No   Lack of Transportation (Non-Medical): No  Physical Activity: Insufficiently Active   Days of Exercise per Week: 3 days   Minutes of Exercise per Session: 30 min  Stress: No Stress Concern Present   Feeling of Stress : Only a little  Social Connections: Press photographer of Communication with Friends and Family: More than three times a week   Frequency of Social Gatherings with Friends and Family: Twice a week   Attends Religious Services: 1 to 4 times per year   Active Member of [employer] or Organizations: Yes   Attends Banker Meetings: 1 to 4 times per year   Marital Status: Married  Catering manager Violence: Not At Risk   Fear of Current or Ex-Partner: No   Emotionally Abused: No   Physically Abused: No   Sexually Abused: No    PHYSICAL EXAM  GENERAL EXAM/CONSTITUTIONAL: Vitals:  Vitals:   10/28/21 1011  BP: 113/76  Pulse: 89  Weight: 210 lb (95.3 kg)  Height: 5\' 4"  (1.626 m)   Body mass index is 36.05 kg/m. Wt Readings from Last 3 Encounters:  10/28/21 210 lb (95.3 kg)  01/28/21 195 lb 8 oz (88.7 kg)  12/13/20 216 lb 3.2 oz (98.1 kg)   Patient is in no distress; well developed, nourished and groomed; neck is supple  CARDIOVASCULAR: Examination of carotid arteries is normal; no carotid bruits Regular rate and rhythm, no murmurs Examination of peripheral vascular system by observation and palpation is  normal  EYES: Pupils round and reactive to light, Visual fields full to confrontation, Extraocular movements intacts,  No results found.  MUSCULOSKELETAL: Gait, strength, tone, movements noted in Neurologic exam below  NEUROLOGIC: MENTAL STATUS:  No flowsheet data found. awake, alert, oriented to person, place and time recent and remote memory intact normal attention and concentration language fluent, comprehension intact, naming intact fund of knowledge appropriate  CRANIAL NERVE:  2nd, 3rd, 4th, 6th - pupils equal and reactive to light, visual fields full to confrontation, extraocular muscles intact, no nystagmus 5th - facial sensation symmetric 7th - facial strength symmetric 8th - hearing intact 9th - palate elevates symmetrically, uvula  midline 11th - shoulder shrug symmetric 12th - tongue protrusion midline  MOTOR:  normal bulk and tone, full strength in the BUE, BLE  SENSORY:  normal and symmetric to light touch, pinprick, temperature, vibration  COORDINATION:  finger-nose-finger, fine finger movements normal  REFLEXES:  deep tendon reflexes present and symmetric  GAIT/STATION:  normal   DIAGNOSTIC DATA (LABS, IMAGING, TESTING) - I reviewed patient records, labs, notes, testing and imaging myself where available.  Lab Results  Component Value Date   WBC 15.8 (H) 12/18/2020   HGB 9.6 (L) 12/18/2020   HCT 28.4 (L) 12/18/2020   MCV 84.3 12/18/2020   PLT 268 12/18/2020      Component Value Date/Time   NA 137 12/19/2020 0950   NA 137 07/18/2020 1555   K 2.9 (L) 12/19/2020 0950   CL 106 12/19/2020 0950   CO2 23 12/19/2020 0950   GLUCOSE 140 (H) 12/19/2020 0950   BUN 6 12/19/2020 0950   BUN 8 07/18/2020 1555   CREATININE 0.67 12/19/2020 0950   CALCIUM 8.7 (L) 12/19/2020 0950   PROT 5.0 (L) 12/18/2020 0442   PROT 7.8 07/18/2020 1555   ALBUMIN 2.4 (L) 12/18/2020 0442   ALBUMIN 4.9 07/18/2020 1555   AST 21 12/18/2020 0442   ALT 16 12/18/2020 0442    ALKPHOS 88 12/18/2020 0442   BILITOT 1.1 12/18/2020 0442   BILITOT 0.9 07/18/2020 1555   GFRNONAA >60 12/19/2020 0950   GFRAA 153 07/18/2020 1555   No results found for: CHOL, HDL, LDLCALC, LDLDIRECT, TRIG Lab Results  Component Value Date   HGBA1C 5.5 07/18/2020   No results found for: VITAMINB12 No results found for: TSH   Head CT 10/17/2021 No acute intracranial abnormality.   I personally reviewed brain Images and previous EEG reports.   ASSESSMENT AND PLAN  26 y.o. year old female  with no reported past medical history who is presenting with seizure-like activity.  Patient reports she was sitting down when it happened, there was reported abnormal movements and postictal confusion.  She also reported a history of being clumsy in the past, dropping things.  Patient history is consistent for seizures, I informed her that I will obtain a routine EEG, if normal then will continue to monitor her but if the EEG is abnormal, then I will obtain a brain MRI and bring the sooner to discuss antiseizure medications.   Since the event, she has been complaining of constant headache and some dizziness that she describes as being lightheaded.  Described the headaches are bilateral frontal, present all the time, with minimal improvement after taking Tylenol.  She is currently breast-feeding therefore choice of medication may be limited, but Flexeril is somewhat safe with breast feeding. We will give her a very short course, about 10 pills. For now, follow-up with your primary care doctor and return sooner if worse.   1. Seizure-like activity (HCC)   2. Tension-type headache, not intractable, unspecified chronicity pattern     Patient Instructions  Routine EEG  Follow up with PMD  Return if worse    Per [facility] statutes, patients with seizures are not allowed to drive until they have been seizure-free for six months.  Other recommendations include using caution when using heavy  equipment or power tools. Avoid working on ladders or at heights. Take showers instead of baths.  Do not swim alone.  Ensure the water temperature is not too high on the home water heater. Do not go swimming alone. Do not  lock yourself in a room alone (i.e. bathroom). When caring for infants or small children, sit down when holding, feeding, or changing them to minimize risk of injury to the child in the event you have a seizure. Maintain good sleep hygiene. Avoid alcohol.  Also recommend adequate sleep, hydration, good diet and minimize stress.   During the Seizure  - First, ensure adequate ventilation and place patients on the floor on their left side  Loosen clothing around the neck and ensure the airway is patent. If the patient is clenching the teeth, do not force the mouth open with any object as this can cause severe damage - Remove all items from the surrounding that can be hazardous. The patient may be oblivious to what's happening and may not even know what he or she is doing. If the patient is confused and wandering, either gently guide him/her away and block access to outside areas - Reassure the individual and be comforting - Call 911. In most cases, the seizure ends before EMS arrives. However, there are cases when seizures may last over 3 to 5 minutes. Or the individual may have developed breathing difficulties or severe injuries. If a pregnant patient or a person with diabetes develops a seizure, it is prudent to call an ambulance. - Finally, if the patient does not regain full consciousness, then call EMS. Most patients will remain confused for about 45 to 90 minutes after a seizure, so you must use judgment in calling for help. - Avoid restraints but make sure the patient is in a bed with padded side rails - Place the individual in a lateral position with the neck slightly flexed; this will help the saliva drain from the mouth and prevent the tongue from falling backward - Remove all  nearby furniture and other hazards from the area - Provide verbal assurance as the individual is regaining consciousness - Provide the patient with privacy if possible - Call for help and start treatment as ordered by the caregiver   After the Seizure (Postictal Stage)  After a seizure, most patients experience confusion, fatigue, muscle pain and/or a headache. Thus, one should permit the individual to sleep. For the next few days, reassurance is essential. Being calm and helping reorient the person is also of importance.  Most seizures are painless and end spontaneously. Seizures are not harmful to others but can lead to complications such as stress on the lungs, brain and the heart. Individuals with prior lung problems may develop labored breathing and respiratory distress.     Orders Placed This Encounter  Procedures   EEG adult    Meds ordered this encounter  Medications   cyclobenzaprine (FLEXERIL) 10 MG tablet    Sig: Take 1 tablet (10 mg total) by mouth 3 (three) times daily as needed for up to 10 doses for muscle spasms.    Dispense:  10 tablet    Refill:  0    Return if symptoms worsen or fail to improve.    Windell Norfolk, MD 10/28/2021, 2:08 PM  [facility] [address], [address] [place], [place] [address] [phone]

## 2021-10-28 NOTE — Patient Instructions (Signed)
Routine EEG  Follow up with PMD  Return if worse

## 2021-11-04 ENCOUNTER — Ambulatory Visit: Payer: Medicaid Other | Admitting: Neurology

## 2021-11-04 DIAGNOSIS — R569 Unspecified convulsions: Secondary | ICD-10-CM | POA: Diagnosis not present

## 2021-11-04 NOTE — Procedures (Signed)
° ° °  History:  1 yof with seizure like activity   EEG classification: Awake and drowsy  Description of the recording: The background rhythms of this recording consists of a fairly well modulated medium amplitude alpha rhythm of 10 Hz that is reactive to eye opening and closure. As the record progresses, the patient appears to remain in the waking state throughout the recording. Photic stimulation was performed, did not show any abnormalities. Hyperventilation was also performed, did not show any abnormalities. Toward the end of the recording, the patient enters the drowsy state with slight symmetric slowing seen. The patient never enters stage II sleep. No abnormal epileptiform discharges seen during this recording. There was no focal slowing. EKG monitor shows no evidence of cardiac rhythm abnormalities with a heart rate of 60.  Impression: This is a normal EEG recording in the waking and drowsy state. No evidence of interictal epileptiform discharges seen. A normal EEG does not exclude a diagnosis of epilepsy.    Windell Norfolk, MD [facility]

## 2021-11-05 NOTE — Progress Notes (Signed)
[employer]  Your EEG (Brain wave test) was normal. In particular, there were no epileptiform discharges and no seizures. No further action is required on this test at this time. Please keep any upcoming appointments or tests and  call us with any interim questions, concerns, have another event, problems or updates. Thanks,   Alric Ran, MD

## 2021-11-21 ENCOUNTER — Telehealth: Payer: Self-pay | Admitting: Neurology

## 2021-11-21 NOTE — Telephone Encounter (Signed)
Pt called wanting to know when she will be called with her EEG results. Please advise. °

## 2021-11-24 ENCOUNTER — Encounter: Payer: Self-pay | Admitting: Neurology

## 2021-11-24 NOTE — Telephone Encounter (Signed)
The patient also sent a mychart message. Results have been sent through the patient portal. ?

## 2022-04-05 IMAGING — US US MFM OB LIMITED
1 series · 15 of 28 positions shown · non-contrast
Comparison: none

[Series 1: us mfm ob limited · 15 of 28 slices shown]
[im 1/28]
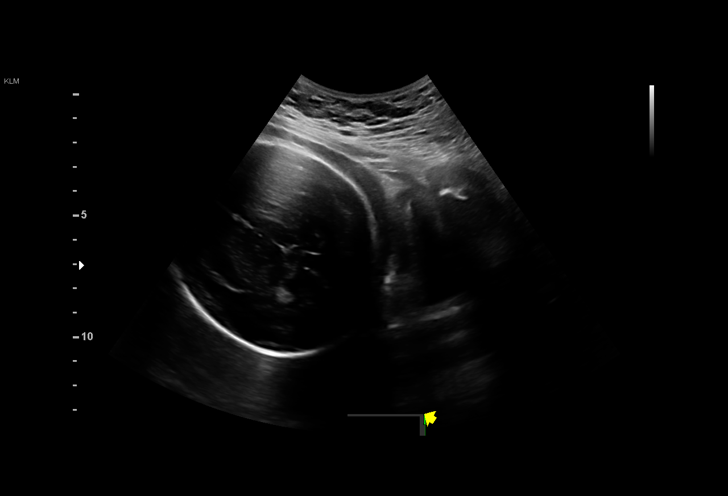
[im 3/28]
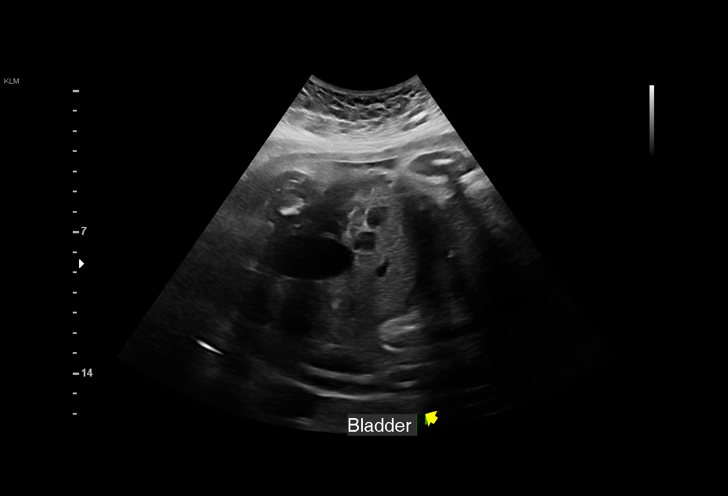
[im 5/28]
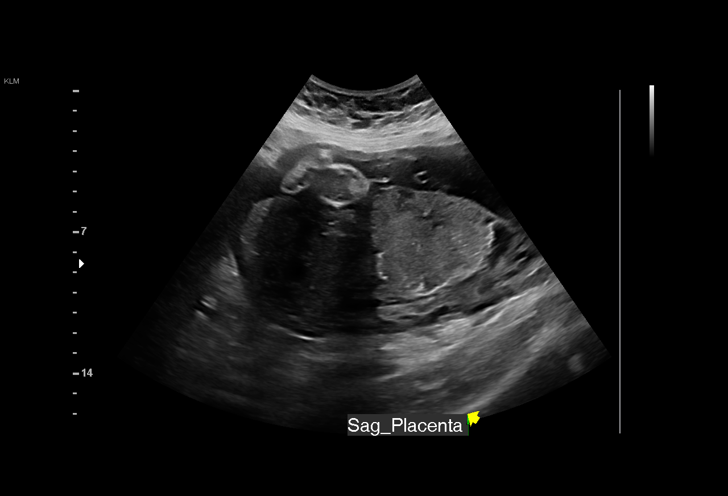
[im 7/28]
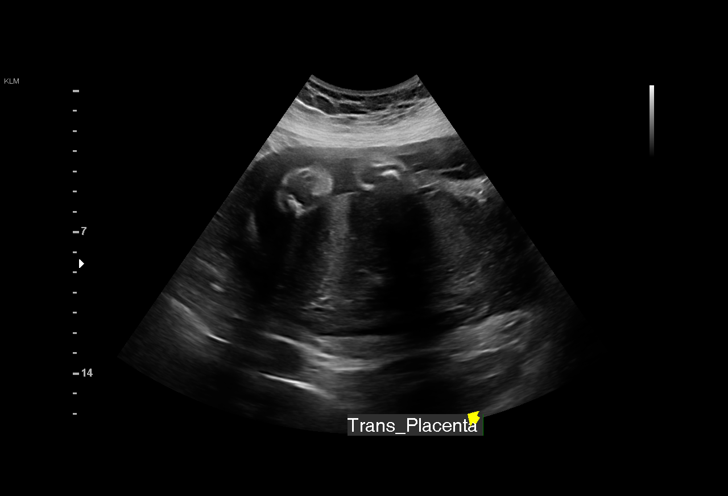
[im 9/28]
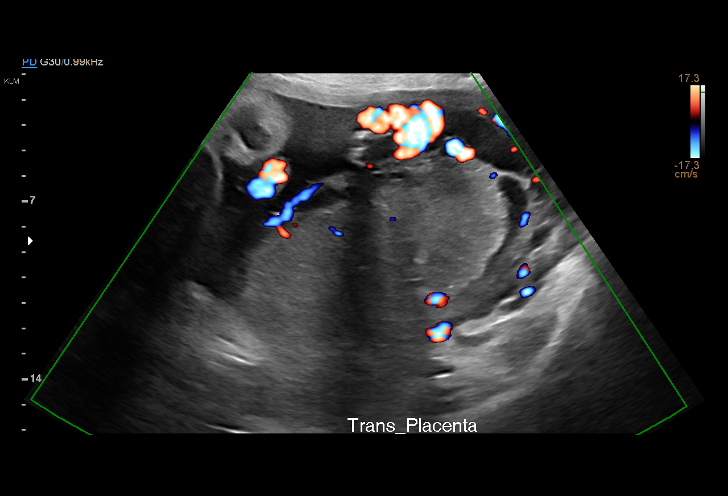
[im 11/28]
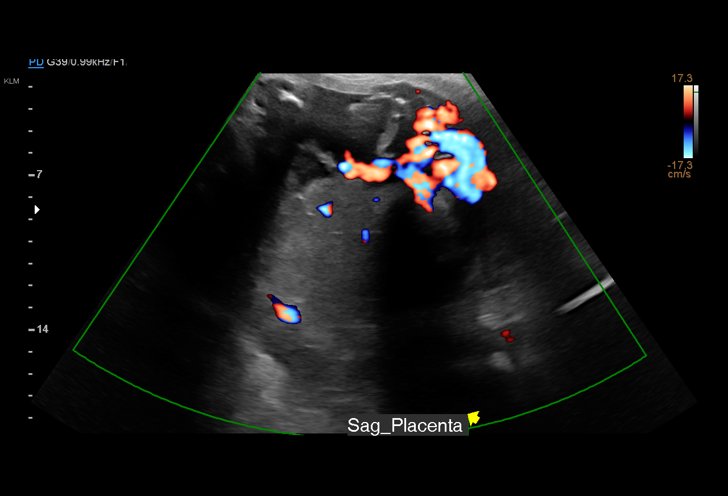
[im 13/28]
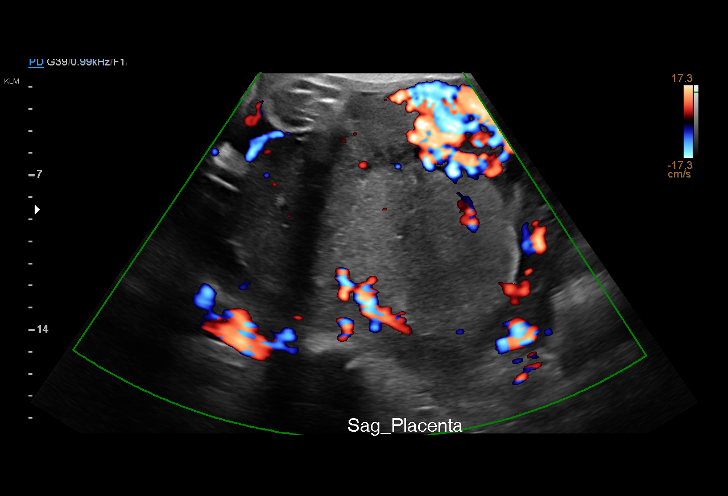
[im 15/28]
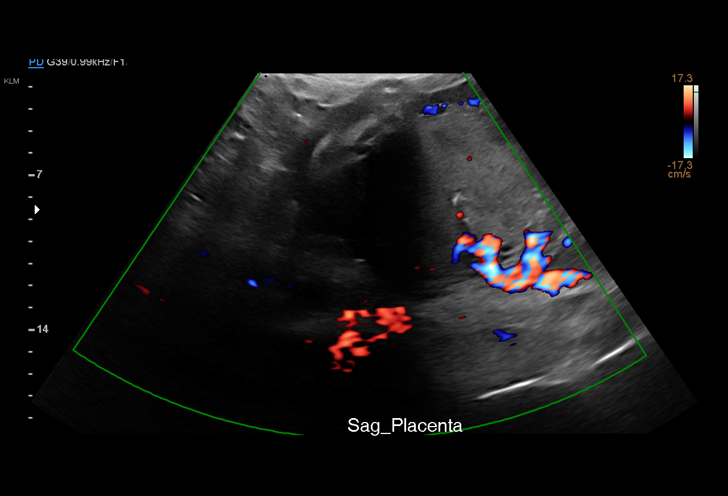
[im 16/28]
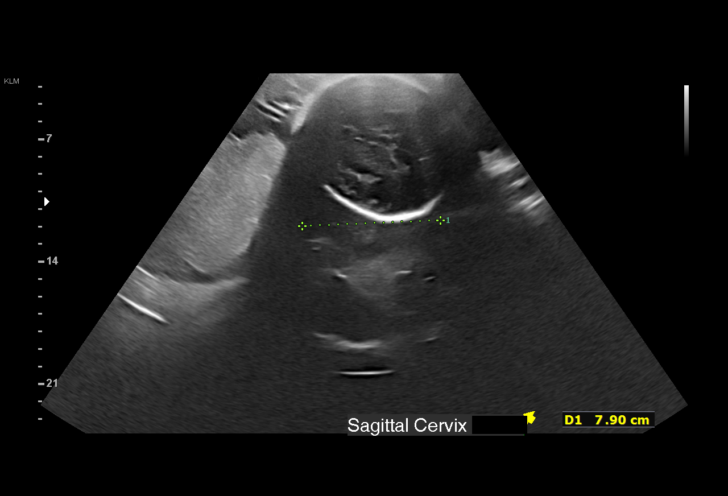
[im 18/28]
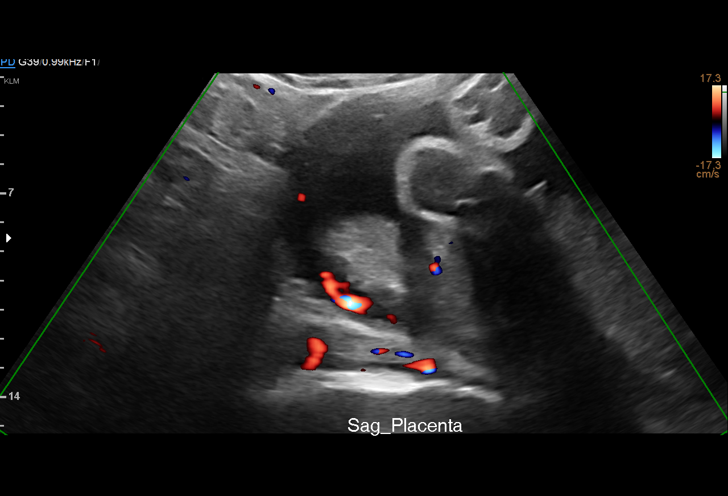
[im 20/28]
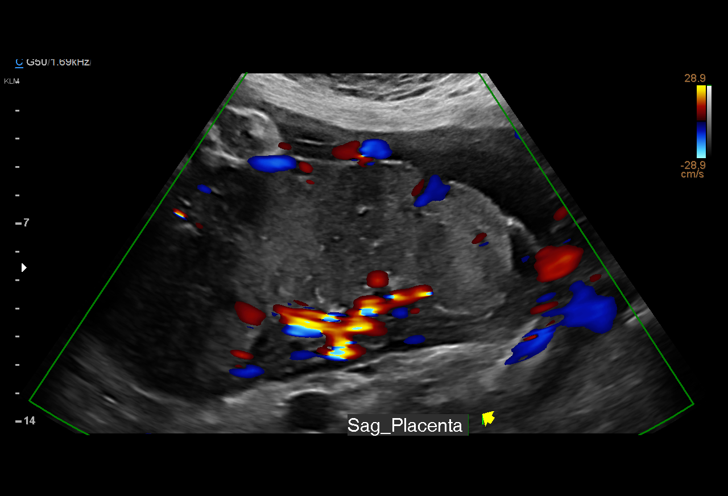
[im 22/28]
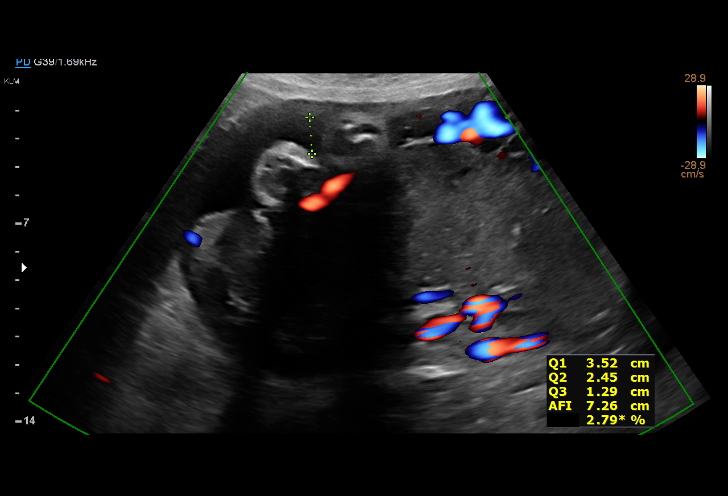
[im 24/28]
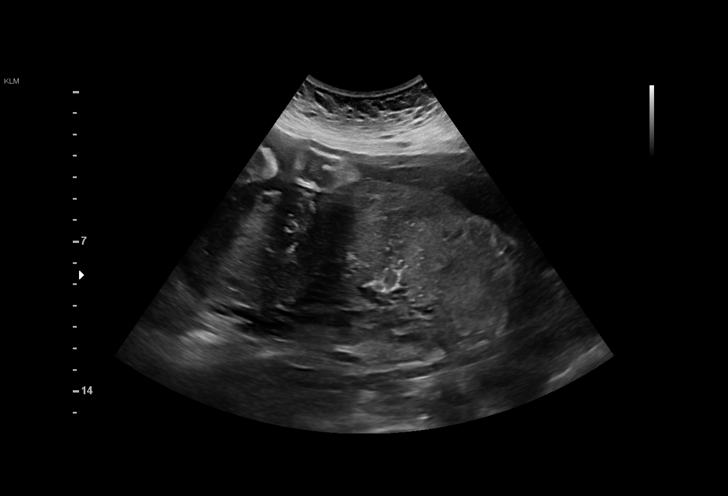
[im 26/28]
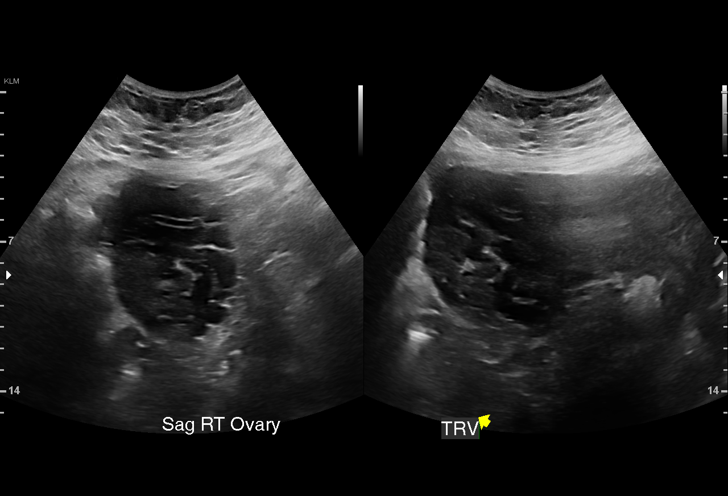
[im 28/28]
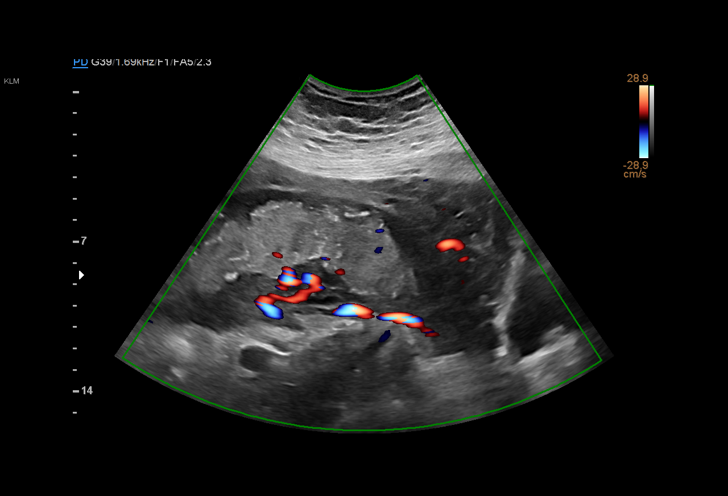

[15 of 28 positions shown; findings below may reference images not displayed]

CNM                                      [HOSPITAL]

Indications

 Vaginal bleeding in pregnancy, third trimester
 33 weeks gestation of pregnancy
Fetal Evaluation

 Num Of Fetuses:         1
 Fetal Heart Rate(bpm):  143
 Cardiac Activity:       Observed
 Presentation:           Cephalic
 Placenta:               Posterior
 P. Cord Insertion:      Visualized

 Amniotic Fluid
 AFI FV:      Within normal limits

 AFI Sum(cm)     %Tile       Largest Pocket(cm)
 8.95            10

 RUQ(cm)       RLQ(cm)       LUQ(cm)        LLQ(cm)


 Comment:    No placental abruption or previa identified.
OB History

 Gravidity:    3         Term:   2        Prem:   0        SAB:   0
 TOP:          0       Ectopic:  0        Living: 2
Gestational Age

 Best:          33w 2d     Det. By:  Early Ultrasound         EDD:   01/27/21
                                     (07/03/20)
Anatomy

 Stomach:               Appears normal, left   Bladder:                Appears normal
                        sided
Cervix Uterus Adnexa

 Cervix
 Not visualized (advanced GA >24wks)

 Uterus
 No abnormality visualized.

 Right Ovary
 Within normal limits.

 Left Ovary
 Within normal limits.

 Adnexa
 No abnormality visualized.
Comments

 This patient presented to the DEL due to lower abdominal
 cramping and spotting.  Her pregnancy has been complicated
 by chronic hypertension currently treated with labetalol and
 gestational diabetes treated with glyburide.

 A limited ultrasound performed today shows that the fetus is
 in the vertex presentation.

 There was normal amniotic fluid noted.

## 2022-09-23 ENCOUNTER — Encounter: Payer: Self-pay | Admitting: Neurology

## 2022-10-20 ENCOUNTER — Ambulatory Visit
Admission: EM | Admit: 2022-10-20 | Discharge: 2022-10-20 | Disposition: A | Discharge status: Home / Self Care | Payer: Medicaid Other | Attending: Family Medicine | Admitting: Family Medicine

## 2022-10-20 DIAGNOSIS — J069 Acute upper respiratory infection, unspecified: Secondary | ICD-10-CM | POA: Insufficient documentation

## 2022-10-20 DIAGNOSIS — Z1152 Encounter for screening for COVID-19: Secondary | ICD-10-CM | POA: Diagnosis not present

## 2022-10-20 NOTE — ED Provider Notes (Signed)
[facility]    CSN: [id number] Arrival date & time: 10/20/22  0900      History   Chief Complaint Chief Complaint  Patient presents with   Influenza    Congestion cough sore throat Cherie Dark - Entered by patient    HPI Suzanne James is a 27 y.o. female.   Pt reports sinus pressure, congestion, cough, sore throat, bilateral ear stabbing pain and drainage x 1 day      Past Medical History:  Diagnosis Date   Strep throat     Patient Active Problem List   Diagnosis Date Noted   Postpartum hemorrhage 12/17/2020   History of preterm delivery 12/15/2020   History of gestational diabetes 11/05/2020   Chronic hypertension 08/15/2020   History of gestational hypertension 07/16/2020   Anxiety 06/14/2020    Past Surgical History:  Procedure Laterality Date   TONSILLECTOMY      OB History     Gravida  3   Para  3   Term  2   Preterm  1   AB      Living  3      SAB      IAB      Ectopic      Multiple      Live Births  3            Home Medications    Prior to Admission medications   Medication Sig Start Date End Date Taking? Authorizing Provider  acetaminophen (TYLENOL) 325 MG tablet Take 650 mg by mouth every 6 (six) hours as needed for mild pain or headache.    [provider]  cyclobenzaprine (FLEXERIL) 10 MG tablet Take 1 tablet (10 mg total) by mouth 3 (three) times daily as needed for up to 10 doses for muscle spasms. 10/28/21   Alric Ran, MD  Norgestimate-Ethinyl Estradiol Triphasic 0.18/0.215/0.25 MG-35 MCG tablet Take 1 tablet by mouth daily.  03/24/13  [provider]    Family History Family History  Problem Relation Age of Onset   Asthma Sister    Hypertension Sister    Heart attack Maternal Grandmother    Hypertension Paternal Grandmother    Diabetes Paternal Grandmother     Social History Social History   Tobacco Use   Smoking status: Never   Smokeless tobacco: Never  Vaping Use    Vaping Use: Never used  Substance Use Topics   Alcohol use: No   Drug use: No     Allergies   Patient has no known allergies.   Review of Systems Review of Systems PER HPI  Physical Exam Triage Vital Signs ED Triage Vitals  Enc Vitals Group     BP 10/20/22 0923 112/76     Pulse Rate 10/20/22 0923 70     Resp 10/20/22 0923 20     Temp 10/20/22 0923 98.1 F (36.7 C)     Temp Source 10/20/22 0923 Oral     SpO2 10/20/22 0923 97 %     Weight --      Height --      Head Circumference --      Peak Flow --      Pain Score 10/20/22 0924 3     Pain Loc --      Pain Edu? --      Excl. in [place]? --    No data found.  Updated Vital Signs BP 112/76 (BP Location: Right Arm)   Pulse 70   Temp  98.1 F (36.7 C) (Oral)   Resp 20   LMP 10/03/2022   SpO2 97%   Breastfeeding No   Visual Acuity Right Eye Distance:   Left Eye Distance:   Bilateral Distance:    Right Eye Near:   Left Eye Near:    Bilateral Near:     Physical Exam Vitals and nursing note reviewed.  Constitutional:      Appearance: Normal appearance.  HENT:     Head: Atraumatic.     Right Ear: Tympanic membrane and external ear normal.     Left Ear: Tympanic membrane and external ear normal.     Nose: Rhinorrhea present.     Mouth/Throat:     Mouth: Mucous membranes are moist.     Pharynx: Posterior oropharyngeal erythema present.  Eyes:     Extraocular Movements: Extraocular movements intact.     Conjunctiva/sclera: Conjunctivae normal.  Cardiovascular:     Rate and Rhythm: Normal rate and regular rhythm.     Heart sounds: Normal heart sounds.  Pulmonary:     Effort: Pulmonary effort is normal.     Breath sounds: Normal breath sounds. No wheezing or rales.  Musculoskeletal:        General: Normal range of motion.     Cervical back: Normal range of motion and neck supple.  Skin:    General: Skin is warm and dry.  Neurological:     Mental Status: She is alert and oriented to person, place, and  time.  Psychiatric:        Mood and Affect: Mood normal.        Thought Content: Thought content normal.      UC Treatments / Results  Labs (all labs ordered are listed, but only abnormal results are displayed) Labs Reviewed  SARS CORONAVIRUS 2 (TAT 6-24 HRS)    EKG   Radiology No results found.  Procedures Procedures (including critical care time)  Medications Ordered in UC Medications - No data to display  Initial Impression / Assessment and Plan / UC Course  I have reviewed the triage vital signs and the nursing notes.  Pertinent labs & imaging results that were available during my care of the patient were reviewed by me and considered in my medical decision making (see chart for details).     Vital signs and exam reassuring and suggestive of a viral respiratory Infection.  COVID testing pending, treat with supportive over-the-counter medications, home care and return precautions. Final Clinical Impressions(s) / UC Diagnoses   Final diagnoses:  Viral URI   Discharge Instructions   None    ED Prescriptions   None    PDMP not reviewed this encounter.   Volney American, [place] 10/20/22 1515

## 2022-10-20 NOTE — ED Triage Notes (Signed)
Pt reports sinus pressure, congestion, cough, sore throat, bilateral ear stabbing pain and drainage x 1 day

## 2022-10-21 LAB — SARS CORONAVIRUS 2 (TAT 6-24 HRS): SARS Coronavirus 2: NEGATIVE

## 2022-11-03 ENCOUNTER — Ambulatory Visit: Payer: Medicaid Other | Admitting: Neurology

## 2022-11-19 ENCOUNTER — Encounter: Payer: Self-pay | Admitting: Neurology

## 2022-11-19 ENCOUNTER — Ambulatory Visit: Payer: Medicaid Other | Admitting: Neurology

## 2022-11-19 VITALS — BP 126/87 | HR 65 | Ht 63.0 in | Wt 227.0 lb

## 2022-11-19 DIAGNOSIS — G44209 Tension-type headache, unspecified, not intractable: Secondary | ICD-10-CM | POA: Diagnosis not present

## 2022-11-19 NOTE — Patient Instructions (Signed)
Continue current medications  Return as needed

## 2022-11-19 NOTE — Progress Notes (Signed)
[facility]  PATIENT: Suzanne James DOB: July 06, 1996  REQUESTING CLINICIAN: Lavella Lemons, PA HISTORY FROM: Patient  REASON FOR VISIT: Seizure like activity/syncope    HISTORICAL  CHIEF COMPLAINT:  Chief Complaint  Patient presents with   Follow-up    Rm 12. Patient with daughter.  Patient reports having more headache days than not since December, but reports not seizure like activity.     INTERVAL HISTORY 11/19/2022:  Patient presents today for follow-up, last visit was over a year ago.  Since then she has not had any additional seizures. Her EEG was normal.  She is now complaining of new headache.She reports in December she was diagnosed with the flu and since then she had constant daily headache until mid February when the headache broke away.  During that time she was put on 3 rounds of steroid which did not help, and even her doctor gave her Imitrex.  But since mid February the headaches resolved. She reports gaining some weight, which cause her to have interrupted sleep. She snores a lot and wakes up during the night. She reports that she wakes up with headaches but by midday, the headaches go away. Denies falling asleep when riding in a car, denies falling asleep during conversation. She also has a toddler.    HISTORY OF PRESENT ILLNESS:  This is a 27 year old woman with no reported past medical history who is presenting after a syncopal versus seizure.  Patient reports on February 3 she was sitting at home having dinner with husband and next thing that she remembers is thinking waking up from her bed and her husband telling her goodbye as he was getting ready for work.  She was told by her husband that he had she had a seizure.  She reported that husband saw her dropping her head, he called her name but she was unresponsive then she pushed the table and started kicking her legs.  She was held by her husband therefore she did not fall to the ground.  Entire  episode lasted about a minute.  She presented to the ED, initial work-up negative and a head CT was also negative.  She was discharged home with close neuro follow-up.  She does report a history of being clumsy, dropping things unsure if these are myoclonic jerks or not, denies any family history of seizures, reported history of head trauma, no previous history of syncope or seizure-like activity. Patient also reports since the event she has been having constant frontal headache, currently 4 out of 10 and some episode of lightheadedness.  She takes Tylenol which provide some relief but does not take the headaches away. She has a 101-monthold baby and she is breast-feeding, reports sleep is okay will get 8 hours per night.   Handedness: Right   Onset: 2 weeks ago   Seizure Type: unclear, syncope vs. seizure  Current frequency: Only one   Any injuries from seizures: None   Seizure risk factors: None reported, history of head trauma  Previous ASMs: None  Currenty ASMs: None  ASMs side effects: Not applicable  Brain Images: head CT normal   Previous EEGs: None   OTHER MEDICAL CONDITIONS: None reported   REVIEW OF SYSTEMS: Full 14 system review of systems performed and negative with exception of: as noted in the HPI   ALLERGIES: No Known Allergies  HOME MEDICATIONS: Outpatient Medications Prior to Visit  Medication Sig Dispense Refill   acetaminophen (TYLENOL) 325 MG tablet Take 650 mg by  mouth every 6 (six) hours as needed for mild pain or headache.     cyclobenzaprine (FLEXERIL) 10 MG tablet Take 1 tablet (10 mg total) by mouth 3 (three) times daily as needed for up to 10 doses for muscle spasms. 10 tablet 0   No facility-administered medications prior to visit.    PAST MEDICAL HISTORY: Past Medical History:  Diagnosis Date   Strep throat     PAST SURGICAL HISTORY: Past Surgical History:  Procedure Laterality Date   TONSILLECTOMY      FAMILY HISTORY: Family  History  Problem Relation Age of Onset   Asthma Sister    Hypertension Sister    Heart attack Maternal Grandmother    Hypertension Paternal Grandmother    Diabetes Paternal Grandmother     SOCIAL HISTORY: Social History   Socioeconomic History   Marital status: Married    Spouse name: Not on file   Number of children: Not on file   Years of education: Not on file   Highest education level: Not on file  Occupational History   Not on file  Tobacco Use   Smoking status: Never   Smokeless tobacco: Never  Vaping Use   Vaping Use: Never used  Substance and Sexual Activity   Alcohol use: No   Drug use: No   Sexual activity: Yes    Birth control/protection: Pill  Other Topics Concern   Not on file  Social History Narrative   Not on file   Social Determinants of Health   Financial Resource Strain: Low Risk  (01/28/2021)   Overall Financial Resource Strain (CARDIA)    Difficulty of Paying Living Expenses: Not hard at all  Food Insecurity: No Food Insecurity (01/28/2021)   Hunger Vital Sign    Worried About Running Out of Food in the Last Year: Never true    Mobile in the Last Year: Never true  Transportation Needs: No Transportation Needs (01/28/2021)   PRAPARE - Hydrologist (Medical): No    Lack of Transportation (Non-Medical): No  Physical Activity: Insufficiently Active (01/28/2021)   Exercise Vital Sign    Days of Exercise per Week: 3 days    Minutes of Exercise per Session: 30 min  Stress: No Stress Concern Present (01/28/2021)   [place]    Feeling of Stress : Only a little  Recent Concern: Stress - Stress Concern Present (11/04/2020)   [place]    Feeling of Stress : To some extent  Social Connections: Socially Integrated (01/28/2021)   Social Connection and Isolation Panel [NHANES]    Frequency  of Communication with Friends and Family: More than three times a week    Frequency of Social Gatherings with Friends and Family: Twice a week    Attends Religious Services: 1 to 4 times per year    Active Member of [employer] or Organizations: Yes    Attends Archivist Meetings: 1 to 4 times per year    Marital Status: Married  Human resources officer Violence: Not At Risk (01/28/2021)   Humiliation, Afraid, Rape, and Kick questionnaire    Fear of Current or Ex-Partner: No    Emotionally Abused: No    Physically Abused: No    Sexually Abused: No    PHYSICAL EXAM  GENERAL EXAM/CONSTITUTIONAL: Vitals:  Vitals:   11/19/22 0908  BP: 126/87  Pulse: 65  Weight: 227 lb (  103 kg)  Height: '5\' 3"'$  (1.6 m)    Body mass index is 40.21 kg/m. Wt Readings from Last 3 Encounters:  11/19/22 227 lb (103 kg)  10/28/21 210 lb (95.3 kg)  01/28/21 195 lb 8 oz (88.7 kg)   Patient is in no distress; well developed, nourished and groomed; neck is supple  EYES: Pupils round and reactive to light, Visual fields full to confrontation, Extraocular movements intacts,  No results found.  MUSCULOSKELETAL: Gait, strength, tone, movements noted in Neurologic exam below  NEUROLOGIC: MENTAL STATUS:      No data to display         awake, alert, oriented to person, place and time recent and remote memory intact normal attention and concentration language fluent, comprehension intact, naming intact fund of knowledge appropriate  CRANIAL NERVE:  2nd, 3rd, 4th, 6th - pupils equal and reactive to light, visual fields full to confrontation, extraocular muscles intact, no nystagmus. Normal funduscopic  5th - facial sensation symmetric 7th - facial strength symmetric 8th - hearing intact 9th - palate elevates symmetrically, uvula midline 11th - shoulder shrug symmetric 12th - tongue protrusion midline  MOTOR:  normal bulk and tone, full strength in the BUE, BLE  SENSORY:  normal and symmetric to  light touch  COORDINATION:  finger-nose-finger, fine finger movements normal  REFLEXES:  deep tendon reflexes present and symmetric  GAIT/STATION:  normal   DIAGNOSTIC DATA (LABS, IMAGING, TESTING) - I reviewed patient records, labs, notes, testing and imaging myself where available.  Lab Results  Component Value Date   WBC 15.8 (H) 12/18/2020   HGB 9.6 (L) 12/18/2020   HCT 28.4 (L) 12/18/2020   MCV 84.3 12/18/2020   PLT 268 12/18/2020      Component Value Date/Time   NA 137 12/19/2020 0950   NA 137 07/18/2020 1555   K 2.9 (L) 12/19/2020 0950   CL 106 12/19/2020 0950   CO2 23 12/19/2020 0950   GLUCOSE 140 (H) 12/19/2020 0950   BUN 6 12/19/2020 0950   BUN 8 07/18/2020 1555   CREATININE 0.67 12/19/2020 0950   CALCIUM 8.7 (L) 12/19/2020 0950   PROT 5.0 (L) 12/18/2020 0442   PROT 7.8 07/18/2020 1555   ALBUMIN 2.4 (L) 12/18/2020 0442   ALBUMIN 4.9 07/18/2020 1555   AST 21 12/18/2020 0442   ALT 16 12/18/2020 0442   ALKPHOS 88 12/18/2020 0442   BILITOT 1.1 12/18/2020 0442   BILITOT 0.9 07/18/2020 1555   GFRNONAA >60 12/19/2020 0950   GFRAA 153 07/18/2020 1555   No results found for: "CHOL", "HDL", "LDLCALC", "LDLDIRECT", "TRIG" Lab Results  Component Value Date   HGBA1C 5.5 07/18/2020   No results found for: "VITAMINB12" No results found for: "TSH"   Head CT 10/17/2021 No acute intracranial abnormality.   EEG 11/04/2021 This is a normal EEG recording in the waking and drowsy state. No evidence of interictal epileptiform discharges seen. A normal EEG does not exclude a diagnosis of epilepsy   I personally reviewed brain Images and previous EEG reports.   ASSESSMENT AND PLAN  27 y.o. year old female  with no reported past medical history who is presenting with complaints of headache that started in December but as of mid February, the headaches resolved.  Currently she is not having any continuous headaches, but reports occasionally she will wake up with a  headache but by noon the headache will go away.  I have informed patient if her headache reoccurred or gets worse, to please  call us.  At that time we will consider starting her on a preventive medication.  Return as needed.   1. Tension-type headache, not intractable, unspecified chronicity pattern      Patient Instructions  Continue current medications  Return as needed    Per [facility] statutes, patients with seizures are not allowed to drive until they have been seizure-free for six months.  Other recommendations include using caution when using heavy equipment or power tools. Avoid working on ladders or at heights. Take showers instead of baths.  Do not swim alone.  Ensure the water temperature is not too high on the home water heater. Do not go swimming alone. Do not lock yourself in a room alone (i.e. bathroom). When caring for infants or small children, sit down when holding, feeding, or changing them to minimize risk of injury to the child in the event you have a seizure. Maintain good sleep hygiene. Avoid alcohol.  Also recommend adequate sleep, hydration, good diet and minimize stress.   During the Seizure  - First, ensure adequate ventilation and place patients on the floor on their left side  Loosen clothing around the neck and ensure the airway is patent. If the patient is clenching the teeth, do not force the mouth open with any object as this can cause severe damage - Remove all items from the surrounding that can be hazardous. The patient may be oblivious to what's happening and may not even know what he or she is doing. If the patient is confused and wandering, either gently guide him/her away and block access to outside areas - Reassure the individual and be comforting - Call 911. In most cases, the seizure ends before EMS arrives. However, there are cases when seizures may last over 3 to 5 minutes. Or the individual may have developed breathing difficulties or  severe injuries. If a pregnant patient or a person with diabetes develops a seizure, it is prudent to call an ambulance. - Finally, if the patient does not regain full consciousness, then call EMS. Most patients will remain confused for about 45 to 90 minutes after a seizure, so you must use judgment in calling for help. - Avoid restraints but make sure the patient is in a bed with padded side rails - Place the individual in a lateral position with the neck slightly flexed; this will help the saliva drain from the mouth and prevent the tongue from falling backward - Remove all nearby furniture and other hazards from the area - Provide verbal assurance as the individual is regaining consciousness - Provide the patient with privacy if possible - Call for help and start treatment as ordered by the caregiver   After the Seizure (Postictal Stage)  After a seizure, most patients experience confusion, fatigue, muscle pain and/or a headache. Thus, one should permit the individual to sleep. For the next few days, reassurance is essential. Being calm and helping reorient the person is also of importance.  Most seizures are painless and end spontaneously. Seizures are not harmful to others but can lead to complications such as stress on the lungs, brain and the heart. Individuals with prior lung problems may develop labored breathing and respiratory distress.     No orders of the defined types were placed in this encounter.   No orders of the defined types were placed in this encounter.   Return if symptoms worsen or fail to improve.    Alric Ran, MD 11/19/2022, 9:29 AM  [facility] [address] [place], [place] [address] [phone]

## 2022-12-14 ENCOUNTER — Other Ambulatory Visit (HOSPITAL_BASED_OUTPATIENT_CLINIC_OR_DEPARTMENT_OTHER): Payer: Self-pay

## 2022-12-14 DIAGNOSIS — G4710 Hypersomnia, unspecified: Secondary | ICD-10-CM

## 2022-12-14 DIAGNOSIS — R0683 Snoring: Secondary | ICD-10-CM

## 2022-12-14 DIAGNOSIS — R5383 Other fatigue: Secondary | ICD-10-CM

## 2022-12-21 ENCOUNTER — Ambulatory Visit: Payer: Medicaid Other | Attending: Physician Assistant | Admitting: Pulmonary Disease

## 2022-12-21 DIAGNOSIS — G4733 Obstructive sleep apnea (adult) (pediatric): Secondary | ICD-10-CM | POA: Diagnosis not present

## 2022-12-21 DIAGNOSIS — R0683 Snoring: Secondary | ICD-10-CM

## 2022-12-21 DIAGNOSIS — G4710 Hypersomnia, unspecified: Secondary | ICD-10-CM

## 2022-12-21 DIAGNOSIS — R5383 Other fatigue: Secondary | ICD-10-CM

## 2022-12-29 DIAGNOSIS — R0683 Snoring: Secondary | ICD-10-CM | POA: Diagnosis not present

## 2022-12-29 NOTE — Procedures (Signed)
     Patient Name: Suzanne James, Suzanne James Date: 12/21/2022 Gender: Female D.O.B: 24-Jul-1996 Age (years): 26 Referring Provider: Vickki Muff PA Height (inches): 63 Interpreting Physician: Coralyn Helling MD, ABSM Weight (lbs): 227 RPSGT: Alfonso Ellis BMI: 40 MRN: [id number]  CLINICAL INFORMATION Sleep Study Type: HST  Indication for sleep study: snoring, sleep disruption and daytime sleepiness.  SLEEP STUDY TECHNIQUE A multi-channel overnight portable sleep study was performed. The channels recorded were: nasal airflow, thoracic respiratory movement, and oxygen saturation with a pulse oximetry. Snoring was also monitored.  MEDICATIONS Patient self administered medications include: N/A.  SLEEP ARCHITECTURE Patient was studied for 470.9 minutes. The sleep efficiency was 98.1 % and the patient was supine for 0%. The arousal index was 0.0 per hour.  RESPIRATORY PARAMETERS The overall AHI was 8.4 per hour, with a central apnea index of 0 per hour.  The oxygen nadir was 89% during sleep.   CARDIAC DATA Mean heart rate during sleep was 81.5 bpm.  IMPRESSIONS - Mild obstructive sleep apnea occurred during this study (AHI = 8.4/h). - Mild oxygen desaturation was noted during this study (Min O2 = 89%). - Patient snored 28% during the sleep.  DIAGNOSIS - Obstructive Sleep Apnea (G47.33)  RECOMMENDATIONS - Additional therapies include CPAP, oral appliance, or surgical assessment. - Avoid alcohol, sedatives and other CNS depressants that may worsen sleep apnea and disrupt normal sleep architecture. - Sleep hygiene should be reviewed to assess factors that may improve sleep quality. - Weight management and regular exercise should be initiated or continued. - Sleep medicine consultation available as needed to assist with management.  [Electronically signed] 12/29/2022 01:37 PM  Coralyn Helling MD, ABSM Diplomate, American Board of Sleep Medicine NPI: [id number]  [place]  [facility] PH: [phone]   FX: [phone] ACCREDITED BY THE AMERICAN ACADEMY OF SLEEP MEDICINE

## 2023-01-04 ENCOUNTER — Encounter: Payer: Self-pay | Admitting: Women's Health

## 2023-01-04 ENCOUNTER — Ambulatory Visit (INDEPENDENT_AMBULATORY_CARE_PROVIDER_SITE_OTHER): Payer: Medicaid Other | Admitting: Women's Health

## 2023-01-04 ENCOUNTER — Other Ambulatory Visit (HOSPITAL_COMMUNITY)
Admission: RE | Admit: 2023-01-04 | Discharge: 2023-01-04 | Disposition: A | Discharge status: Home / Self Care | Payer: Medicaid Other | Source: Ambulatory Visit | Attending: Women's Health | Admitting: Women's Health

## 2023-01-04 VITALS — BP 116/68 | HR 67 | Ht 63.0 in | Wt 225.8 lb

## 2023-01-04 DIAGNOSIS — Z01419 Encounter for gynecological examination (general) (routine) without abnormal findings: Secondary | ICD-10-CM | POA: Diagnosis not present

## 2023-01-04 NOTE — Progress Notes (Signed)
WELL-WOMAN EXAMINATION Patient name: Suzanne James MRN [id number]  Date of birth: 12-27-1995 Chief Complaint:   Annual Exam  History of Present Illness:   Suzanne James is a 27 y.o. 1999 Caucasian female being seen today for a routine well-woman exam.  Current complaints: 1st few days of period heavy, does not want birth control  PCP: [facility]      does not desire labs Patient's last menstrual period was 01/04/2023. The current method of family planning is rhythm method.  Last pap 2021. Results were: negative per pt report at [facility] . H/O abnormal pap: no Last mammogram: never. Results were: N/A. Family h/o breast cancer: no Last colonoscopy: never. Results were: N/A. Family h/o colorectal cancer: no     01/04/2023    8:43 AM 04/02/2021    1:48 PM 11/04/2020   10:10 AM 07/18/2020    2:57 PM 06/14/2020   12:10 PM  Depression screen PHQ 2/9  Decreased Interest 0 0 1 1 0  Down, Depressed, Hopeless 1 0 1 0 0  PHQ - 2 Score 1 0 2 1 0  Altered sleeping 0  Tired, decreased energy 0  Change in appetite 1 0 3 3 0  Feeling bad or failure about yourself  1 0 0 0 0  Trouble concentrating 0  Moving slowly or fidgety/restless 0 0 0 0 0  Suicidal thoughts 0 0 0 0 0  PHQ-9 Score 0  Difficult doing work/chores     Not difficult at all        04/02/2021    1:50 PM 11/04/2020   10:10 AM 06/14/2020   12:10 PM  GAD 7 : Generalized Anxiety Score  Nervous, Anxious, on Edge 1 1 0  Control/stop worrying 1 1 0  Worry too much - different things 1 1 0  Trouble relaxing 1 2 0  Restless 1 2 0  Easily annoyed or irritable 1 1 0  Afraid - awful might happen 1 1 0  Total GAD 7 Score 7 9 0  Anxiety Difficulty   Not difficult at all     Review of Systems:   Pertinent items are noted in HPI Denies any headaches, blurred vision, fatigue, shortness of breath, chest pain, abdominal pain, abnormal vaginal discharge/itching/odor/irritation, problems with  periods, bowel movements, urination, or intercourse unless otherwise stated above. Pertinent History Reviewed:  Reviewed past medical,surgical, social and family history.  Reviewed problem list, medications and allergies. Physical Assessment:   Vitals:   01/04/23 0850  BP: 116/68  Pulse: 67  Weight: 225 lb 12.8 oz (102.4 kg)  Height:  (1.6 m)  Body mass index is 40 kg/m.        Physical Examination:   General appearance - well appearing, and in no distress  Mental status - alert, oriented to person, place, and time  Psych:  She has a normal mood and affect  Skin - warm and dry, normal color, no suspicious lesions noted  Chest - effort normal, all lung fields clear to auscultation bilaterally  Heart - normal rate and regular rhythm  Neck:  midline trachea, no thyromegaly or nodules  Breasts - breasts appear normal, no suspicious masses, no skin or nipple changes or  axillary nodes  Abdomen - soft, nontender, nondistended, no masses or organomegaly  Pelvic - VULVA: normal appearing vulva with no masses, tenderness or lesions  VAGINA: normal appearing vagina with  normal color and discharge, no lesions  CERVIX: normal appearing cervix without discharge or lesions, no CMT  Thin prep pap is done w/ reflex HR HPV cotesting  UTERUS: uterus is felt to be normal size, shape, consistency and nontender   ADNEXA: No adnexal masses or tenderness noted.  Extremities:  No swelling or varicosities noted  Chaperone: Latisha Cresenzo    No results found for this or any previous visit (from the past 24 hour(s)).  Assessment & Plan:  1) Well-Woman Exam  2) Heavy period> 1st few days, declines birth control, try ibuprofen  q6hr on heavy days  Labs/procedures today: pap  Mammogram: @ 28yo, or sooner if problems Colonoscopy: @ 28yo, or sooner if problems  No orders of the defined types were placed in this encounter.   Meds: No orders of the defined types were placed in this  encounter.   Follow-up: Return in about 1 year (around 01/04/2024) for Physical.  Cheral Marker CNM, WHNP-BC 01/04/2023 9:15 AM

## 2023-01-04 NOTE — Addendum Note (Signed)
Addended by: Moss Mc on: 01/04/2023 11:41 AM   Modules accepted: Orders

## 2023-01-04 NOTE — Patient Instructions (Signed)
Primary Care Providers Dr. Zack Hall ([place]) [phone] [facility] [phone] [facility] ([place]) [phone] [facility] ([place]) [phone] [facility] () [phone] [facility] ([place]) [phone] [facility] [place] [phone] [facility] [phone]  

## 2023-01-08 LAB — CYTOLOGY - PAP
Comment: NEGATIVE
Diagnosis: NEGATIVE
High risk HPV: NEGATIVE

## 2023-02-09 ENCOUNTER — Encounter (HOSPITAL_BASED_OUTPATIENT_CLINIC_OR_DEPARTMENT_OTHER): Payer: Self-pay | Admitting: Pulmonary Disease

## 2023-02-09 ENCOUNTER — Ambulatory Visit (INDEPENDENT_AMBULATORY_CARE_PROVIDER_SITE_OTHER): Payer: Medicaid Other | Admitting: Pulmonary Disease

## 2023-02-09 VITALS — BP 120/62 | HR 74 | Temp 98.3°F | Ht 63.0 in | Wt 221.0 lb

## 2023-02-09 DIAGNOSIS — G4733 Obstructive sleep apnea (adult) (pediatric): Secondary | ICD-10-CM | POA: Diagnosis not present

## 2023-02-09 DIAGNOSIS — Z7189 Other specified counseling: Secondary | ICD-10-CM | POA: Diagnosis not present

## 2023-02-09 DIAGNOSIS — G4719 Other hypersomnia: Secondary | ICD-10-CM | POA: Diagnosis not present

## 2023-02-09 DIAGNOSIS — E669 Obesity, unspecified: Secondary | ICD-10-CM

## 2023-02-09 DIAGNOSIS — G4730 Sleep apnea, unspecified: Secondary | ICD-10-CM | POA: Diagnosis not present

## 2023-02-09 NOTE — Patient Instructions (Signed)
Will arrange for auto CPAP set up  Follow up in 4 months 

## 2023-02-09 NOTE — Progress Notes (Signed)
[facility]  Chief Complaint  Patient presents with   Consult    Patient is here to talk about her sleep.     Past Surgical History:  She  has a past surgical history that includes Tonsillectomy.  Past Medical History:  Headaches  Constitutional:  BP 120/62 (BP Location: Left Arm, Patient Position: Sitting, Cuff Size: Large)   Pulse 74   Temp 98.3 F (36.8 C) (Oral)   Ht 5\' 3"  (1.6 m)   Wt 221 lb (100.2 kg)   SpO2 98%   BMI 39.15 kg/m   Brief Summary:  Suzanne James is a 27 y.o. female with obstructive sleep apnea.      Subjective:   She was seen by neurology recently for persistent headaches.  There was concern she could have a sleep disordered contributing to this.  She snores and stops breathing at night.  She gets sleepy during the day.  She did a home sleep study that showed mild obstructive sleep apnea.  She goes to sleep at 9 pm.  She falls asleep in about 30 minutes.  She wakes up some times to use the bathroom.  She gets out of bed at 645 am.  She feels tired in the morning.  She usually gets a headache in the morning and this fades during the day  She does not use anything to help her fall sleep or stay awake.  She grinds her teeth and talks in her sleep.  She has tried a mouth guard for teeth grinding, but had trouble keeping this in her mouth.  She denies sleep walking, sleep talking, bruxism, or nightmares.  There is no history of restless legs.  She denies sleep hallucinations, sleep paralysis, or cataplexy.  The Epworth score is 11 out of 24.   Physical Exam:   Appearance - well kempt   ENMT - no sinus tenderness, no oral exudate, no LAN, Mallampati 3 airway, no stridor  Respiratory - equal breath sounds bilaterally, no wheezing or rales  CV - s1s2 regular rate and rhythm, no murmurs  Ext - no clubbing, no edema  Skin - no rashes  Psych - normal mood and affect   Sleep Tests:  HST 01/12/23 >> AHI  8.4, SpO2 low 89%  Social History:  She  reports that she has never smoked. She has never used smokeless tobacco. She reports that she does not drink alcohol and does not use drugs.  Family History:  Her family history includes Anxiety disorder in her mother; Asthma in her sister; Diabetes in her mother and paternal grandmother; Heart attack in her maternal grandmother; Hypercholesterolemia in her mother; Hypertension in her mother, paternal grandmother, and sister.    Discussion:  She has snoring, sleep disruption, apnea, and excess daytime sleepiness.  She has persistent headaches.  Her BMI is > 35.  Her home sleep study shows obstructive sleep apnea.  Assessment/Plan:   Obstructive sleep apnea. - reviewed her sleep study results - discussed how sleep apnea can impact her health - treatment options reviewed - she doesn't think she would be able to tolerate an oral appliance - will arrange for [employer] auto CPAP set up 5 to 15 cm H2O - discussed options to assist with mask fit  Obesity. - reviewed options to assist with weight loss, including diet modification and exercise  Time Spent Involved in Patient Care on Day of Examination:  36 minutes  Follow up:   Patient Instructions  Will  arrange for auto CPAP set up  Follow up in 4 months  Medication List:   Allergies as of 02/09/2023   No Known Allergies      Medication List        Accurate as of Feb 09, 2023 10:24 AM. If you have any questions, ask your nurse or doctor.          acetaminophen 325 MG tablet Commonly known as: TYLENOL Take 650 mg by mouth every 6 (six) hours as needed for mild pain or headache.        Signature:  Coralyn Helling, MD [facility] Pager - [phone] 02/09/2023, 10:24 AM

## 2023-02-11 ENCOUNTER — Ambulatory Visit: Payer: Medicaid Other | Admitting: Neurology

## 2023-03-22 ENCOUNTER — Encounter (HOSPITAL_BASED_OUTPATIENT_CLINIC_OR_DEPARTMENT_OTHER): Payer: Self-pay | Admitting: Pulmonary Disease

## 2023-03-25 NOTE — Telephone Encounter (Signed)
Received the following message from patient:   "I have been back and forth with insurance and the home equipment company since my last visit.  Insurance is waiting for a pre authorization and suggested I reach out to you to see if there's anything that could be done to get things moving for approval."  Community message has been sent to [employer] to check on status of her cpap machine. Will update once I have a response.

## 2023-05-19 ENCOUNTER — Encounter: Payer: Self-pay | Admitting: Women's Health

## 2024-01-06 ENCOUNTER — Ambulatory Visit: Payer: Self-pay | Admitting: Women's Health

## 2024-01-20 ENCOUNTER — Ambulatory Visit: Payer: Self-pay | Admitting: Women's Health
